# Patient Record
Sex: Male | Born: 1960 | Race: Black or African American | Hispanic: No | Marital: Married | Smoking: Never smoker
Health system: Southern US, Community
[De-identification: ages and names within clinical notes are randomized; demographics above are authoritative.]

## PROBLEM LIST (undated history)

## (undated) DIAGNOSIS — R0902 Hypoxemia: Secondary | ICD-10-CM

## (undated) DIAGNOSIS — G4730 Sleep apnea, unspecified: Secondary | ICD-10-CM

## (undated) HISTORY — DX: Sleep apnea, unspecified: G47.30

## (undated) HISTORY — DX: Hypoxemia: R09.02

---

## 2002-04-06 ENCOUNTER — Encounter: Payer: Self-pay | Admitting: Family Medicine

## 2002-04-06 ENCOUNTER — Encounter: Admission: RE | Admit: 2002-04-06 | Discharge: 2002-04-06 | Payer: Self-pay | Admitting: Family Medicine

## 2002-07-19 ENCOUNTER — Encounter: Admission: RE | Admit: 2002-07-19 | Discharge: 2002-08-16 | Payer: Self-pay | Admitting: Neurosurgery

## 2016-11-01 ENCOUNTER — Emergency Department (HOSPITAL_COMMUNITY)
Admission: EM | Admit: 2016-11-01 | Discharge: 2016-11-02 | Disposition: A | Discharge status: Home / Self Care | Payer: BLUE CROSS/BLUE SHIELD | Attending: Emergency Medicine | Admitting: Emergency Medicine

## 2016-11-01 ENCOUNTER — Encounter (HOSPITAL_COMMUNITY): Payer: Self-pay

## 2016-11-01 DIAGNOSIS — Y92410 Unspecified street and highway as the place of occurrence of the external cause: Secondary | ICD-10-CM | POA: Insufficient documentation

## 2016-11-01 DIAGNOSIS — Y939 Activity, unspecified: Secondary | ICD-10-CM | POA: Diagnosis not present

## 2016-11-01 DIAGNOSIS — S39012A Strain of muscle, fascia and tendon of lower back, initial encounter: Secondary | ICD-10-CM | POA: Insufficient documentation

## 2016-11-01 DIAGNOSIS — R51 Headache: Secondary | ICD-10-CM | POA: Diagnosis not present

## 2016-11-01 DIAGNOSIS — Y999 Unspecified external cause status: Secondary | ICD-10-CM | POA: Diagnosis not present

## 2016-11-01 DIAGNOSIS — S169XXA Unspecified injury of muscle, fascia and tendon at neck level, initial encounter: Secondary | ICD-10-CM | POA: Diagnosis present

## 2016-11-01 DIAGNOSIS — S161XXA Strain of muscle, fascia and tendon at neck level, initial encounter: Secondary | ICD-10-CM | POA: Insufficient documentation

## 2016-11-01 MED ORDER — OXYCODONE HCL 5 MG PO TABS
10.0000 mg | ORAL_TABLET | Freq: Once | ORAL | Status: AC
  Administered 2016-11-02: 10 mg via ORAL
  Filled 2016-11-01: qty 2

## 2016-11-01 MED ORDER — IBUPROFEN 400 MG PO TABS
800.0000 mg | ORAL_TABLET | Freq: Once | ORAL | Status: AC | PRN
  Administered 2016-11-01: 800 mg via ORAL

## 2016-11-01 MED ORDER — IBUPROFEN 400 MG PO TABS
ORAL_TABLET | ORAL | Status: AC
  Filled 2016-11-01: qty 2

## 2016-11-01 MED ORDER — METHOCARBAMOL 500 MG PO TABS
500.0000 mg | ORAL_TABLET | Freq: Once | ORAL | Status: AC
  Administered 2016-11-02: 500 mg via ORAL
  Filled 2016-11-01: qty 1
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
## 2016-11-02 ENCOUNTER — Encounter (HOSPITAL_COMMUNITY): Payer: Self-pay | Admitting: Radiology

## 2016-11-02 ENCOUNTER — Emergency Department (HOSPITAL_COMMUNITY): Payer: BLUE CROSS/BLUE SHIELD

## 2016-11-02 DIAGNOSIS — S161XXA Strain of muscle, fascia and tendon at neck level, initial encounter: Secondary | ICD-10-CM | POA: Diagnosis not present

## 2016-11-02 MED ORDER — METHOCARBAMOL 500 MG PO TABS: 500.0000 mg | ORAL_TABLET | Freq: Two times a day (BID) | ORAL | 0 refills | Status: DC

## 2016-11-02 MED ORDER — NAPROXEN 500 MG PO TABS: 500.0000 mg | ORAL_TABLET | Freq: Two times a day (BID) | ORAL | 0 refills | Status: DC
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
## 2016-11-02 NOTE — Discharge Instructions (Signed)
1. Medications: robaxin, naproxyn, usual home medications 2. Treatment: rest, drink plenty of fluids, gentle stretching as discussed, alternate ice and heat 3. Follow Up: Please followup with your primary doctor in 3 days for discussion of your diagnoses and further evaluation after today's visit; if you do not have a primary care doctor use the resource guide provided to find one;  Return to the ER for worsening back pain, difficulty walking, loss of bowel or bladder control, abdominal distention, blood in your urine or feces, loss of bowel or bladder control or other concerning symptoms

## 2018-10-27 ENCOUNTER — Encounter: Payer: Self-pay | Admitting: Internal Medicine

## 2020-01-03 ENCOUNTER — Other Ambulatory Visit: Payer: Self-pay | Admitting: Gastroenterology

## 2020-01-03 DIAGNOSIS — R7989 Other specified abnormal findings of blood chemistry: Secondary | ICD-10-CM

## 2020-01-05 ENCOUNTER — Telehealth: Payer: Self-pay

## 2020-01-05 ENCOUNTER — Ambulatory Visit
Admission: RE | Admit: 2020-01-05 | Discharge: 2020-01-05 | Disposition: A | Discharge status: Home / Self Care | Payer: BC Managed Care – PPO | Source: Ambulatory Visit | Attending: Gastroenterology | Admitting: Gastroenterology

## 2020-01-05 ENCOUNTER — Encounter (INDEPENDENT_AMBULATORY_CARE_PROVIDER_SITE_OTHER): Payer: Self-pay

## 2020-01-05 DIAGNOSIS — R7989 Other specified abnormal findings of blood chemistry: Secondary | ICD-10-CM

## 2020-01-05 NOTE — Telephone Encounter (Signed)
I called patient to see if he is still a patient here because we have received notes from Dr.Mann. the number we have on file Is incorrect. YRL,RMA

## 2020-01-07 LAB — HM COLONOSCOPY

## 2020-01-10 ENCOUNTER — Encounter: Payer: Self-pay | Admitting: Internal Medicine

## 2020-01-30 ENCOUNTER — Ambulatory Visit: Payer: BC Managed Care – PPO | Attending: Internal Medicine

## 2020-01-30 DIAGNOSIS — Z23 Encounter for immunization: Secondary | ICD-10-CM | POA: Insufficient documentation

## 2020-01-30 NOTE — Progress Notes (Signed)
   Covid-19 Vaccination Clinic  Name:  Jeffrey Barton    MRN: [id number] DOB: 1961-03-19  01/30/2020  Mr. Lack was observed post Covid-19 immunization for 15 minutes without incident. He was provided with Vaccine Information Sheet and instruction to access the V-Safe system.   [name] was instructed to call 911 with any severe reactions post vaccine: Marland Kitchen Difficulty breathing  . Swelling of face and throat  . A fast heartbeat  . A bad rash all over body  . Dizziness and weakness   Immunizations Administered    Name Date Dose VIS Date Route   Pfizer COVID-19 Vaccine 01/30/2020 12:17 PM 0.3 mL 11/05/2019 Intramuscular   Manufacturer: [employer], [employer]   Lot: [id number]   NDC: [address]-1

## 2020-02-02 ENCOUNTER — Telehealth: Payer: Self-pay
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
## 2020-02-22 ENCOUNTER — Ambulatory Visit: Payer: BC Managed Care – PPO | Attending: Internal Medicine

## 2020-02-22 DIAGNOSIS — Z23 Encounter for immunization: Secondary | ICD-10-CM

## 2020-02-22 NOTE — Progress Notes (Signed)
   Covid-19 Vaccination Clinic  Name:  Jeffrey Barton    MRN: [id number] DOB: 1961-07-20  02/22/2020  Mr. [name] was observed post Covid-19 immunization for 15 minutes without incident. He was provided with Vaccine Information Sheet and instruction to access the V-Safe system.   Mr. [name] was instructed to call 911 with any severe reactions post vaccine: Marland Kitchen Difficulty breathing  . Swelling of face and throat  . A fast heartbeat  . A bad rash all over body  . Dizziness and weakness   Immunizations Administered    Name Date Dose VIS Date Route   Pfizer COVID-19 Vaccine 02/22/2020 12:17 PM 0.3 mL 11/05/2019 Intramuscular   Manufacturer: [employer], [employer]   Lot: [id number]   NDC: [address]-4

## 2020-03-02 ENCOUNTER — Encounter: Payer: Self-pay | Admitting: Internal Medicine

## 2020-03-06 ENCOUNTER — Other Ambulatory Visit: Payer: Self-pay

## 2020-03-06 ENCOUNTER — Ambulatory Visit (INDEPENDENT_AMBULATORY_CARE_PROVIDER_SITE_OTHER): Payer: BC Managed Care – PPO | Admitting: Internal Medicine

## 2020-03-06 ENCOUNTER — Encounter: Payer: Self-pay | Admitting: Internal Medicine

## 2020-03-06 VITALS — BP 118/82 | HR 93 | Temp 98.3°F | Ht 68.0 in | Wt 239.2 lb

## 2020-03-06 DIAGNOSIS — Z712 Person consulting for explanation of examination or test findings: Secondary | ICD-10-CM

## 2020-03-06 DIAGNOSIS — E6609 Other obesity due to excess calories: Secondary | ICD-10-CM | POA: Insufficient documentation

## 2020-03-06 DIAGNOSIS — R7989 Other specified abnormal findings of blood chemistry: Secondary | ICD-10-CM | POA: Insufficient documentation

## 2020-03-06 DIAGNOSIS — E66812 Obesity, class 2: Secondary | ICD-10-CM

## 2020-03-06 DIAGNOSIS — Z6836 Body mass index (BMI) 36.0-36.9, adult: Secondary | ICD-10-CM

## 2020-03-06 DIAGNOSIS — D696 Thrombocytopenia, unspecified: Secondary | ICD-10-CM | POA: Insufficient documentation

## 2020-03-06 HISTORY — DX: Obesity, class 2: E66.812

## 2020-03-06 HISTORY — DX: Other obesity due to excess calories: E66.09

## 2020-03-06 NOTE — Patient Instructions (Signed)
Exercising to Stay Healthy To become healthy and stay healthy, it is recommended that you do moderate-intensity and vigorous-intensity exercise. You can tell that you are exercising at a moderate intensity if your heart starts beating faster and you start breathing faster but can still hold a conversation. You can tell that you are exercising at a vigorous intensity if you are breathing much harder and faster and cannot hold a conversation while exercising. Exercising regularly is important. It has many health benefits, such as:  Improving overall fitness, flexibility, and endurance.  Increasing bone density.  Helping with weight control.  Decreasing body fat.  Increasing muscle strength.  Reducing stress and tension.  Improving overall health. How often should I exercise? Choose an activity that you enjoy, and set realistic goals. Your health care provider can help you make an activity plan that works for you. Exercise regularly as told by your health care provider. This may include:  Doing strength training two times a week, such as: ? Lifting weights. ? Using resistance bands. ? Push-ups. ? Sit-ups. ? Yoga.  Doing a certain intensity of exercise for a given amount of time. Choose from these options: ? A total of 150 minutes of moderate-intensity exercise every week. ? A total of 75 minutes of vigorous-intensity exercise every week. ? A mix of moderate-intensity and vigorous-intensity exercise every week. Children, pregnant women, people who have not exercised regularly, people who are overweight, and older adults may need to talk with a health care provider about what activities are safe to do. If you have a medical condition, be sure to talk with your health care provider before you start a new exercise program. What are some exercise ideas? Moderate-intensity exercise ideas include:  Walking 1 mile (1.6 km) in about 15  minutes.  Biking.  Hiking.  Golfing.  Dancing.  Water aerobics. Vigorous-intensity exercise ideas include:  Walking 4.5 miles (7.2 km) or more in about 1 hour.  Jogging or running 5 miles (8 km) in about 1 hour.  Biking 10 miles (16.1 km) or more in about 1 hour.  Lap swimming.  Roller-skating or in-line skating.  Cross-country skiing.  Vigorous competitive sports, such as football, basketball, and soccer.  Jumping rope.  Aerobic dancing. What are some everyday activities that can help me to get exercise?  Yard work, such as: ? Pushing a lawn mower. ? Raking and bagging leaves.  Washing your car.  Pushing a stroller.  Shoveling snow.  Gardening.  Washing windows or floors. How can I be more active in my day-to-day activities?  Use stairs instead of an elevator.  Take a walk during your lunch break.  If you drive, park your car farther away from your work or school.  If you take public transportation, get off one stop early and walk the rest of the way.  Stand up or walk around during all of your indoor phone calls.  Get up, stretch, and walk around every 30 minutes throughout the day.  Enjoy exercise with a friend. Support to continue exercising will help you keep a regular routine of activity. What guidelines can I follow while exercising?  Before you start a new exercise program, talk with your health care provider.  Do not exercise so much that you hurt yourself, feel dizzy, or get very short of breath.  Wear comfortable clothes and wear shoes with good support.  Drink plenty of water while you exercise to prevent dehydration or heat stroke.  Work out until your breathing   and your heartbeat get faster. Where to find more information  [place] Department of Health and Human Services: www.hhs.gov  Centers for Disease Control and Prevention (CDC): www.cdc.gov Summary  Exercising regularly is important. It will improve your overall fitness,  flexibility, and endurance.  Regular exercise also will improve your overall health. It can help you control your weight, reduce stress, and improve your bone density.  Do not exercise so much that you hurt yourself, feel dizzy, or get very short of breath.  Before you start a new exercise program, talk with your health care provider. This information is not intended to replace advice given to you by your health care provider. Make sure you discuss any questions you have with your health care provider. Document Revised: 10/24/2017 Document Reviewed: 10/02/2017 Elsevier Patient Education  2020 [employer] Inc.  

## 2020-03-06 NOTE — Progress Notes (Signed)
This visit occurred during the SARS-CoV-2 public health emergency.  Safety protocols were in place, including screening questions prior to the visit, additional usage of staff PPE, and extensive cleaning of exam room while observing appropriate contact time as indicated for disinfecting solutions.  Subjective:     Patient ID: Jeffrey Barton , male    DOB: [birth year] , 59 y.o.   MRN: [id number]   Chief Complaint  Patient presents with  . Thyroid Problem    HPI  He is here today for f/u thyroid issues. He recently had TSH performed by GI, advised to come in by his provider, Lolita Cram NP for further evaluation. He denies previous h/o thyroid disease. He denies constipation, unintentional weight gain and cold intolerance.     History reviewed. No pertinent past medical history.   Family History  Problem Relation Age of Onset  . Healthy Mother      Current Outpatient Medications:  .  methocarbamol (ROBAXIN) 500 MG tablet, Take 1 tablet (500 mg total) by mouth 2 (two) times daily. (Patient not taking: Reported on 03/06/2020), Disp: 20 tablet, Rfl: 0 .  naproxen (NAPROSYN) 500 MG tablet, Take 1 tablet (500 mg total) by mouth 2 (two) times daily with a meal. (Patient not taking: Reported on 03/06/2020), Disp: 30 tablet, Rfl: 0   No Known Allergies   Review of Systems  Constitutional: Negative.   Respiratory: Negative.   Cardiovascular: Negative.   Gastrointestinal: Negative.   Neurological: Negative.   Psychiatric/Behavioral: Negative.      Today's Vitals   03/06/20 1141  BP: 118/82  Pulse: 93  Temp: 98.3 F (36.8 C)  Weight: 239 lb 3.2 oz (108.5 kg)  Height: 5\' 8"  (1.727 m)   Body mass index is 36.37 kg/m.   Objective:  Physical Exam Vitals and nursing note reviewed.  Constitutional:      Appearance: Normal appearance. He is obese.  Cardiovascular:     Rate and Rhythm: Normal rate and regular rhythm.     Heart sounds: Normal heart sounds.  Pulmonary:     Effort:  Pulmonary effort is normal.     Breath sounds: Normal breath sounds.  Skin:    General: Skin is warm.  Neurological:     General: No focal deficit present.     Mental Status: He is alert.  Psychiatric:        Mood and Affect: Mood normal.         Assessment And Plan:     1. Abnormal TSH  Previous lab results in full detail was discussed with the patient. I will check additional thyroid labs as listed below. I will make further recommendations once his labs are available for review. He agrees to rto in six months for repeat TSH testing if needed.   - T4, Free - T3, free - Thyroid Peroxidase Antibody  2. Thrombocytopenia (HCC)  CBC from GI reviewed in full detail. I will check repeat labs today.   - CBC no Diff - Hepatitis C antibody  3. Class 2 obesity due to excess calories without serious comorbidity with body mass index (BMI) of 36.0 to 36.9 in adult  He is encouraged to strive for BMi less than 30 to decrease cardiac risk. Importance of regular exercise was discussed with the patient. He is also encouraged to avoid sugary beverages and limit intake of processed/packaged foods.   , MD    THE PATIENT IS ENCOURAGED TO PRACTICE SOCIAL DISTANCING DUE TO THE COVID-19 PANDEMIC.

## 2020-03-07 ENCOUNTER — Encounter: Payer: Self-pay | Admitting: Internal Medicine

## 2020-03-07 LAB — CBC
Hematocrit: 50.2 % (ref 37.5–51.0)
Hemoglobin: 16.6 g/dL (ref 13.0–17.7)
MCH: 30.9 pg (ref 26.6–33.0)
MCHC: 33.1 g/dL (ref 31.5–35.7)
MCV: 93 fL (ref 79–97)
Platelets: 143 10*3/uL — ABNORMAL LOW (ref 150–450)
RBC: 5.38 x10E6/uL (ref 4.14–5.80)
RDW: 12 % (ref 11.6–15.4)
WBC: 6.2 10*3/uL (ref 3.4–10.8)

## 2020-03-07 LAB — HEPATITIS C ANTIBODY: Hep C Virus Ab: <0.1 s/co ratio (ref 0.0–0.9)

## 2020-03-07 LAB — THYROID PEROXIDASE ANTIBODY: Thyroperoxidase Ab SerPl-aCnc: 12 IU/mL (ref 0–34)

## 2020-03-07 LAB — T4, FREE: Free T4: 1.15 ng/dL (ref 0.82–1.77)

## 2020-03-07 LAB — T3, FREE: T3, Free: 3 pg/mL (ref 2.0–4.4)

## 2020-05-08 ENCOUNTER — Encounter: Payer: Self-pay | Admitting: Internal Medicine

## 2020-05-08 ENCOUNTER — Other Ambulatory Visit: Payer: Self-pay

## 2020-05-08 ENCOUNTER — Ambulatory Visit (INDEPENDENT_AMBULATORY_CARE_PROVIDER_SITE_OTHER): Payer: BC Managed Care – PPO | Admitting: Internal Medicine

## 2020-05-08 VITALS — BP 114/78 | HR 84 | Temp 98.5°F | Ht 67.0 in | Wt 246.8 lb

## 2020-05-08 DIAGNOSIS — R351 Nocturia: Secondary | ICD-10-CM | POA: Diagnosis not present

## 2020-05-08 DIAGNOSIS — G4726 Circadian rhythm sleep disorder, shift work type: Secondary | ICD-10-CM | POA: Diagnosis not present
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
## 2020-05-15 ENCOUNTER — Telehealth: Payer: Self-pay

## 2020-05-15 ENCOUNTER — Ambulatory Visit: Payer: BC Managed Care – PPO | Admitting: Internal Medicine

## 2020-05-15 NOTE — Telephone Encounter (Signed)
I called the pt because Dr. Allyne Gee wants to know did he call DMV to find out why he received paperwork. Is he going to move forward with whatswas discussed during his visit? Going to Neuro for sleep study?

## 2020-05-15 NOTE — Telephone Encounter (Signed)
The pt called and said that he spoke to some one named Doristine Church at the [facility] at was told that page 2 of the packet will determine how much of the packet has to be done and what is necessary.

## 2020-05-15 NOTE — Telephone Encounter (Signed)
I was calling the pt because Dr. Allyne Gee wanted to know if the pt called the DMV to see why he needed to have the form filled out.

## 2020-05-23 ENCOUNTER — Telehealth: Payer: Self-pay

## 2020-05-23 NOTE — Telephone Encounter (Signed)
I called the pt to let him know that Dr Allyne Gee said that the pt needed to ask for an extension because she is unable to answer some of the questions on the form until he has his neuro evaluation. Does he have an appointment yet?

## 2020-05-23 NOTE — Telephone Encounter (Signed)
I called the pt to let him know that Dr Allyne Gee said that the pt needed to ask for an extension because she is unable to answer some of the questions on the form until he has his neuro evaluation. Does he have an appointment yet? THE PT SAID NO HE HASN'T RECEIVED A CALL YET FOR AN APPOINTMENT FOR NEURO EVALUATION.

## 2020-06-05 ENCOUNTER — Telehealth: Payer: Self-pay

## 2020-06-05 NOTE — Telephone Encounter (Signed)
The pt came in and said that the [employer] said that they needed page 2 of the form by this Friday.  The pt was reminded that Dr Allyne Gee said for the pt to ask for an extension on his forms because the pt needed to have an evaluation from the neurologist first before the forms can be filled out.

## 2020-06-15 ENCOUNTER — Ambulatory Visit: Payer: BC Managed Care – PPO | Admitting: Neurology

## 2020-06-15 ENCOUNTER — Encounter: Payer: Self-pay | Admitting: Neurology

## 2020-06-15 VITALS — BP 122/82 | HR 76 | Ht 70.0 in | Wt 246.0 lb

## 2020-06-15 DIAGNOSIS — R351 Nocturia: Secondary | ICD-10-CM

## 2020-06-15 DIAGNOSIS — G4719 Other hypersomnia: Secondary | ICD-10-CM | POA: Diagnosis not present

## 2020-06-15 DIAGNOSIS — G4726 Circadian rhythm sleep disorder, shift work type: Secondary | ICD-10-CM

## 2020-06-15 DIAGNOSIS — E669 Obesity, unspecified: Secondary | ICD-10-CM | POA: Diagnosis not present
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
## 2020-06-15 NOTE — Patient Instructions (Signed)

## 2020-07-02 ENCOUNTER — Ambulatory Visit (INDEPENDENT_AMBULATORY_CARE_PROVIDER_SITE_OTHER): Payer: BC Managed Care – PPO | Admitting: Neurology

## 2020-07-02 DIAGNOSIS — G4719 Other hypersomnia: Secondary | ICD-10-CM

## 2020-07-02 DIAGNOSIS — R351 Nocturia: Secondary | ICD-10-CM

## 2020-07-02 DIAGNOSIS — G4720 Circadian rhythm sleep disorder, unspecified type: Secondary | ICD-10-CM

## 2020-07-02 DIAGNOSIS — G4733 Obstructive sleep apnea (adult) (pediatric): Secondary | ICD-10-CM

## 2020-07-02 DIAGNOSIS — E669 Obesity, unspecified: Secondary | ICD-10-CM

## 2020-07-02 DIAGNOSIS — G4726 Circadian rhythm sleep disorder, shift work type: Secondary | ICD-10-CM

## 2020-07-10 ENCOUNTER — Encounter: Payer: Self-pay | Admitting: Neurology

## 2020-07-10 ENCOUNTER — Encounter: Payer: Self-pay | Admitting: Internal Medicine

## 2020-07-10 ENCOUNTER — Telehealth: Payer: Self-pay | Admitting: Neurology

## 2020-07-10 ENCOUNTER — Telehealth: Payer: Self-pay

## 2020-07-10 NOTE — Telephone Encounter (Signed)
The pt called and left a message that he had his sleep study done August 8th and wanted to know if Dr. Allyne Gee could fill out the [facility] form. I left the pt a message that Dr. Allyne Gee was out of the office last week and that the office hasn't received the results of the sleep study.

## 2020-07-10 NOTE — Telephone Encounter (Signed)
Pt calling inquiring about Sleep Study results. Pt need results before DMV deadline on September 10.

## 2020-07-10 NOTE — Addendum Note (Signed)
Addended by: [name] on: 07/10/2020 03:31 PM   Modules accepted: Orders
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
## 2020-07-10 NOTE — Progress Notes (Signed)
Patient referred by Dr. Allyne Gee, seen by me on 06/15/20, diagnostic PSG on 07/02/20.   Please call and notify the patient that the recent sleep study showed severe obstructive sleep apnea. I recommend treatment for this in the form of CPAP. This will require a repeat sleep study for proper titration and mask fitting and correct monitoring of the oxygen saturations. Please explain to patient. I have placed an order in the chart, we will try to expedite.  Thanks.  [name], MD, PhD [facility])

## 2020-07-11 ENCOUNTER — Telehealth: Payer: Self-pay

## 2020-07-11 NOTE — Telephone Encounter (Signed)
-----   Message from [name], MD sent at 07/10/2020  3:31 PM EDT ----- Patient referred by Dr. Allyne Gee, seen by me on 06/15/20, diagnostic PSG on 07/02/20.   Please call and notify the patient that the recent sleep study showed severe obstructive sleep apnea. I recommend treatment for this in the form of CPAP. This will require a repeat sleep study for proper titration and mask fitting and correct monitoring of the oxygen saturations. Please explain to patient. I have placed an order in the chart, we will try to expedite.  Thanks.  [name], MD, PhD [facility])

## 2020-07-11 NOTE — Telephone Encounter (Signed)
I called Jeffrey Barton. I advised Jeffrey Barton that Dr. Athar reviewed their sleep study results and found that severe osa was found and recommends that Jeffrey Barton be treated with a cpap. Dr. Athar recommends that Jeffrey Barton return for a repeat sleep study in order to properly titrate the cpap and ensure a good mask fit. Jeffrey Barton is agreeable to returning for a titration study. I advised Jeffrey Barton that our sleep lab will file with Jeffrey Barton's insurance and call Jeffrey Barton to schedule the sleep study when we hear back from the Jeffrey Barton's insurance regarding coverage of this sleep study. Jeffrey Barton verbalized understanding of results. Jeffrey Barton had no questions at this time but was encouraged to call back if questions arise. ° ° °

## 2020-07-17 ENCOUNTER — Ambulatory Visit (INDEPENDENT_AMBULATORY_CARE_PROVIDER_SITE_OTHER): Payer: BC Managed Care – PPO | Admitting: Neurology

## 2020-07-17 DIAGNOSIS — G4719 Other hypersomnia: Secondary | ICD-10-CM

## 2020-07-17 DIAGNOSIS — G4726 Circadian rhythm sleep disorder, shift work type: Secondary | ICD-10-CM

## 2020-07-17 DIAGNOSIS — G4733 Obstructive sleep apnea (adult) (pediatric): Secondary | ICD-10-CM | POA: Diagnosis not present

## 2020-07-17 DIAGNOSIS — G4720 Circadian rhythm sleep disorder, unspecified type: Secondary | ICD-10-CM

## 2020-07-17 DIAGNOSIS — E669 Obesity, unspecified: Secondary | ICD-10-CM

## 2020-07-17 DIAGNOSIS — G4731 Primary central sleep apnea: Secondary | ICD-10-CM

## 2020-07-17 DIAGNOSIS — R351 Nocturia: Secondary | ICD-10-CM

## 2020-07-18 ENCOUNTER — Telehealth: Payer: Self-pay

## 2020-07-18 NOTE — Telephone Encounter (Signed)
The pt was notified that his DMV form has been completed, faxed to the [facility], a copy has been kept for his chart, and his original is up front for pickup.

## 2020-07-21 LAB — HEPATIC FUNCTION PANEL
ALT: 20 (ref 10–40)
AST: 18 (ref 14–40)
Alkaline Phosphatase: 76 (ref 25–125)
Bilirubin, Direct: 0.11 (ref 0.01–0.4)
Bilirubin, Total: 0.3

## 2020-07-21 LAB — COMPREHENSIVE METABOLIC PANEL: Albumin: 4.1 (ref 3.5–5.0)

## 2020-07-27 ENCOUNTER — Encounter: Payer: Self-pay | Admitting: Neurology

## 2020-07-28 NOTE — Procedures (Signed)
PATIENT'S NAME:  Jeffrey Barton, Jeffrey Barton DOB:      November 29, 1960      MR#:    [id number]     DATE OF RECORDING: 07/17/2020 REFERRING M.D.:  Dorothyann Peng MD Study Performed:   CPAP  Titration HISTORY: 59 year old man, who presents for a full night titration study. His baseline sleep study from 07/02/20 showed severe obstructive sleep apnea, with a total AHI of 37.8/hour, REM AHI of 21.8/hour, supine AHI of 44.6/hour and O2 nadir of 78%. The patient endorsed the Epworth Sleepiness Scale at 8/24 points. BMI of 35.3 kg/m2. The patient's neck circumference measured 16.8 inches.  CURRENT MEDICATIONS: Tylenol   PROCEDURE:  This is a multichannel digital polysomnogram utilizing the SomnoStar 11.2 system.  Electrodes and sensors were applied and monitored per AASM Specifications.   EEG, EOG, Chin and Limb EMG, were sampled at 200 Hz.  ECG, Snore and Nasal Pressure, Thermal Airflow, Respiratory Effort, CPAP Flow and Pressure, Oximetry was sampled at 50 Hz. Digital video and audio were recorded.      The patient was fitted with a large Vitera full facemask due to mouth breathing.  He was initiated on CPAP per AASM standards, on a pressure of 5 cm.  He was titrated to a CPAP pressure of 15 cm but had to be switched to BiPAP therapy due to higher pressure requirement and exhibiting central apneas from the beginning of the study even on low CPAP therapy. He was started on 16/12 cm at epoch 807, 04:34 and his central apneas persisted and a backup rate of 16 was added and 05:06, epoch 871. His final pressure settings were 19/15 with a rate of 16, at which time his AHI was 3/h, O2 nadir 94% with supine non-REM sleep achieved only.  He may have higher pressure requirements even when in rem sleep, unfortunately, REM sleep was not achieved during the study. A dedicated BiPAP titration study may have to be considered in the future.  Lights Out was at 21:54 and Lights On at 05:26. Total recording time (TRT) was 452.5 minutes, with a  total sleep time (TST) of 374.5 minutes. The patient's sleep latency was 0.5 minutes. REM sleep was absent. The sleep efficiency was 82.8 %.    SLEEP ARCHITECTURE: WASO (Wake after sleep onset) was 77 minutes, with mild to moderate sleep fragmentation noted. There were 4.5 minutes in Stage N1, 370 minutes Stage N2, 0 minutes Stage N3 and 0 minutes in Stage REM.  The percentage of Stage N1 was 1.2%, Stage N2 was 98.8%, which is highly increased, Stage N3 and Stage R (REM sleep) were absent. The arousals were noted as: 34 were spontaneous, 11 were associated with PLMs, 14 were associated with respiratory events.  RESPIRATORY ANALYSIS:  There was a total of 145 respiratory events: 1 obstructive apnea, 40 central apneas and 0 mixed apneas with a total of 41 apneas and an apnea index (AI) of 6.6 /hour. There were 104 hypopneas with a hypopnea index of 16.7/hour.   The total APNEA/HYPOPNEA INDEX  (AHI) was 23.2 /hour and the total RESPIRATORY DISTURBANCE INDEX was 23.2 /hour, 145 events in NREM. The REM AHI was n/a vs. a non-REM AHI of 23.2 /hour.  The patient spent 250 minutes of total sleep time in the supine position and 125 minutes in non-supine. The supine AHI was 24.5, versus a non-supine AHI of 20.7.  OXYGEN SATURATION & C02:  The baseline 02 saturation was 92%, with the lowest being 84%. Time spent below 89% saturation  equaled 5 minutes.  PERIODIC LIMB MOVEMENTS:  The patient had a total of 70 Periodic Limb Movements. The Periodic Limb Movement (PLM) index was 11.2 and the PLM Arousal index was 1.8 /hour. Audio and video analysis did not show any abnormal or unusual movements, behaviors, phonations or vocalizations. The patient took 1 bathroom break. The EKG was in keeping with normal sinus rhythm (NSR). Post-study, the patient indicated that sleep was better than usual.    IMPRESSION: 1. Obstructive Sleep Apnea (OSA) 2. Central sleep apnea 3. Dysfunctions associated with sleep stages or arousal  from sleep   RECOMMENDATIONS: 1. This was a somewhat suboptimal titration study as REM sleep was not achieved on the final pressure.  The patient had central sleep apneas throughout the night.  He did have evidence of central apneas to some degree during his baseline sleep study as well.  His sleep disordered breathing did not have complete resolution on CPAP therapy only and he needed higher pressures.  Standard BiPAP therapy was not successful in eliminating the central component of his sleep apnea.  He was therefore placed on BiPAP ST with a final pressure of 19/15 with a rate of 16.  However, he may need additional pressure support when achieving REM sleep.  For now, he will be advised to start home BiPAP ST and we will follow his symptoms and compliance.  He may benefit from a dedicated BiPAP titration study in the near future. The patient should be reminded, that it may take up to 3 months to get fully used to using PAP with all planned sleep. The earlier full compliance is achieved, the better long term compliance tends to be. Please note that untreated obstructive sleep apnea may carry additional perioperative morbidity. Patients with significant obstructive sleep apnea should receive perioperative PAP therapy and the surgeons and particularly the anesthesiologist should be informed of the diagnosis and the severity of the sleep disordered breathing. 2. This study shows sleep fragmentation and abnormal sleep stage percentages; these are nonspecific findings and per se do not signify an intrinsic sleep disorder or a cause for the patient's sleep-related symptoms. Causes include (but are not limited to) the first night effect of the sleep study, circadian rhythm disturbances, medication effect or an underlying mood disorder or medical problem.  3. The patient should be cautioned not to drive, work at heights, or operate dangerous or heavy equipment when tired or sleepy. Review and reiteration of good  sleep hygiene measures should be pursued with any patient. 4. The patient will be seen in follow-up in the sleep clinic at [facility] for discussion of the test results, symptom and treatment compliance review, further management strategies, etc. The referring provider will be notified of the test results.   I certify that I have reviewed the entire raw data recording prior to the issuance of this report in accordance with the Standards of Accreditation of the American Academy of Sleep Medicine (AASM)   [name], MD, PhD Diplomat, American Board of Neurology and Sleep Medicine (Neurology and Sleep Medicine)

## 2020-07-28 NOTE — Progress Notes (Signed)
Patient referred by Dr. Allyne Gee, seen by me on 06/15/20, diagnostic PSG on 07/02/20. Patient had a CPAP titration study on 07/17/20.  Please call and inform patient that I have entered an order for treatment with positive airway pressure (PAP) treatment for obstructive sleep apnea (OSA). He did fairly well during the sleep study but needed higher pressures and also had only light state sleep, did not achieve any deep sleep or dream sleep.  He required a pressure setting called BiPAP ST.  We will arrange for a machine through a DME (durable medical equipment) company of His choice; and I will see the patient back in follow-up in about 10 weeks. Please also explain to the patient that I will be looking out for compliance data, which can be downloaded from the machine (stored on an SD card, that is inserted in the machine) or via remote access through a modem, that is built into the machine. At the time of the followup appointment we will discuss sleep study results and how it is going with PAP treatment at home. Please advise patient to bring His machine at the time of the first FU visit, even though this is cumbersome. Bringing the machine for every visit after that will likely not be needed, but often helps for the first visit to troubleshoot if needed. Please re-enforce the importance of compliance with treatment and the need for [place] to monitor compliance data - often an insurance requirement and actually good feedback for the patient as far as how they are doing.  Also remind patient, that any interim PAP machine or mask issues should be first addressed with the DME company, as they can often help better with technical and mask fit issues. Please ask if patient has a preference regarding DME company.  Please also make sure, the patient has a follow-up appointment with me in about 10 weeks from the setup date, thanks. May see one of our nurse practitioners if needed for proper timing of the FU appointment.  Please  fax or rout report to the referring provider. Thanks,   [name], MD, PhD [facility])

## 2020-07-28 NOTE — Addendum Note (Signed)
Addended by: [name] on: 07/28/2020 11:50 AM   Modules accepted: Orders

## 2020-08-01 ENCOUNTER — Telehealth: Payer: Self-pay

## 2020-08-01 NOTE — Telephone Encounter (Signed)
I called pt. I advised pt that Dr. Frances Furbish reviewed their sleep study results and found that pt did well during the cpap titration study on the BiPAP. Dr. Frances Furbish recommends that pt start BiPAP treatment at home. I reviewed PAP compliance expectations with the pt. Pt is agreeable to starting a BiPAP. I advised pt that an order will be sent to a DME, [facility], and [facility] will call the pt within about one week after they file with the pt's insurance. [facility] will show the pt how to use the machine, fit for masks, and troubleshoot the BiPAP if needed. A follow up appt was made for insurance purposes with Dr. Frances Furbish on 10/25/2020 at 1130 am. Pt verbalized understanding to arrive 15 minutes early and bring their BiPAP. A letter with all of this information in it will be mailed to the pt as a reminder. I verified with the pt that the address we have on file is correct. Pt verbalized understanding of results. Pt had no questions at this time but was encouraged to call back if questions arise. I have sent the order to [employer] and have received confirmation that they have received the order.

## 2020-08-01 NOTE — Telephone Encounter (Signed)
-----   Message from [name], MD sent at 07/28/2020 11:49 AM EDT ----- Patient referred by Dr. Allyne Gee, seen by me on 06/15/20, diagnostic PSG on 07/02/20. Patient had a CPAP titration study on 07/17/20.  Please call and inform patient that I have entered an order for treatment with positive airway pressure (PAP) treatment for obstructive sleep apnea (OSA). He did fairly well during the sleep study but needed higher pressures and also had only light state sleep, did not achieve any deep sleep or dream sleep.  He required a pressure setting called BiPAP ST.  We will arrange for a machine through a DME (durable medical equipment) company of His choice; and I will see the patient back in follow-up in about 10 weeks. Please also explain to the patient that I will be looking out for compliance data, which can be downloaded from the machine (stored on an SD card, that is inserted in the machine) or via remote access through a modem, that is built into the machine. At the time of the followup appointment we will discuss sleep study results and how it is going with PAP treatment at home. Please advise patient to bring His machine at the time of the first FU visit, even though this is cumbersome. Bringing the machine for every visit after that will likely not be needed, but often helps for the first visit to troubleshoot if needed. Please re-enforce the importance of compliance with treatment and the need for [place] to monitor compliance data - often an insurance requirement and actually good feedback for the patient as far as how they are doing.  Also remind patient, that any interim PAP machine or mask issues should be first addressed with the DME company, as they can often help better with technical and mask fit issues. Please ask if patient has a preference regarding DME company.  Please also make sure, the patient has a follow-up appointment with me in about 10 weeks from the setup date, thanks. May see one of our nurse  practitioners if needed for proper timing of the FU appointment.  Please fax or rout report to the referring provider. Thanks,   [name], MD, PhD [facility])

## 2020-09-05 ENCOUNTER — Encounter: Payer: BC Managed Care – PPO | Admitting: Internal Medicine

## 2020-10-07 ENCOUNTER — Ambulatory Visit: Payer: BC Managed Care – PPO | Attending: Internal Medicine

## 2020-10-07 DIAGNOSIS — Z23 Encounter for immunization: Secondary | ICD-10-CM

## 2020-10-07 NOTE — Progress Notes (Signed)
   Covid-19 Vaccination Clinic  Name:  Jeffrey Barton    MRN: [id number] DOB: Apr 12, 1961  10/07/2020  [name] was observed post Covid-19 immunization for 15 minutes without incident. He was provided with Vaccine Information Sheet and instruction to access the V-Safe system.   [name] was instructed to call 911 with any severe reactions post vaccine: Marland Kitchen Difficulty breathing  . Swelling of face and throat  . A fast heartbeat  . A bad rash all over body  . Dizziness and weakness   Immunizations Administered    Name Date Dose VIS Date Route   Pfizer COVID-19 Vaccine 10/07/2020  3:05 PM 0.3 mL 09/13/2020 Intramuscular   Manufacturer: [employer], [employer]   Lot: [id number]   NDC: [address]-6

## 2020-10-25 ENCOUNTER — Ambulatory Visit: Payer: Self-pay | Admitting: Neurology

## 2020-10-30 ENCOUNTER — Encounter: Payer: Self-pay | Admitting: Neurology

## 2020-10-30 ENCOUNTER — Encounter: Payer: BC Managed Care – PPO | Admitting: Nurse Practitioner

## 2020-10-30 ENCOUNTER — Ambulatory Visit: Payer: BC Managed Care – PPO | Admitting: Neurology

## 2020-10-30 VITALS — BP 146/90 | HR 67 | Ht 70.0 in | Wt 251.0 lb

## 2020-10-30 DIAGNOSIS — G4733 Obstructive sleep apnea (adult) (pediatric): Secondary | ICD-10-CM | POA: Diagnosis not present

## 2020-10-30 DIAGNOSIS — G4731 Primary central sleep apnea: Secondary | ICD-10-CM

## 2020-10-30 DIAGNOSIS — Z789 Other specified health status: Secondary | ICD-10-CM | POA: Diagnosis not present

## 2020-10-30 NOTE — Patient Instructions (Signed)
Please continue using your BiPAP regularly. While your insurance requires that you use BiPAP at least 4 hours each night on 70% of the nights, I recommend, that you not skip any nights and use it throughout the night if you can. Getting used to BiPAP and staying with the treatment long term does take time and patience and discipline. Untreated obstructive sleep apnea when it is moderate to severe can have an adverse impact on cardiovascular health and raise her risk for heart disease, arrhythmias, hypertension, congestive heart failure, stroke and diabetes. Untreated obstructive sleep apnea causes sleep disruption, nonrestorative sleep, and sleep deprivation. This can have an impact on your day to day functioning and cause daytime sleepiness and impairment of cognitive function, memory loss, mood disturbance, and problems focussing. Using BiPAP regularly can improve these symptoms.  Please continue to work on using your BiPAP machine consistently every night.  You are not quite there yet when it comes to using the machine more than 4 hours for more than 70% of the time.  The first 90 days will be by November 30, 2020 or thereabouts.  As discussed, I would like to reduce the pressure to 18/14 centimeters at this time from currently 19/15 to see if your mouth dryness improves and urine leak is less and you can tolerate the pressure a little better.  Please call us in about a month or email through MyChart as a reminder for [place] to look at your compliance data for the previous 30 days, we will call you with the report feedback.  Please follow-up to see one of our nurse practitioners in 3 months in this office.
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
## 2020-10-30 NOTE — Progress Notes (Signed)
Order for bipap pressure change sent to [facility] via community message. Confirmation received that the order transmitted was successful.

## 2020-11-01 ENCOUNTER — Other Ambulatory Visit: Payer: Self-pay

## 2020-11-01 ENCOUNTER — Encounter: Payer: Self-pay | Admitting: Nurse Practitioner

## 2020-11-01 ENCOUNTER — Ambulatory Visit (INDEPENDENT_AMBULATORY_CARE_PROVIDER_SITE_OTHER): Payer: BC Managed Care – PPO | Admitting: Nurse Practitioner

## 2020-11-01 VITALS — BP 132/72 | HR 78 | Temp 98.7°F | Ht 70.0 in | Wt 251.8 lb

## 2020-11-01 DIAGNOSIS — E6609 Other obesity due to excess calories: Secondary | ICD-10-CM

## 2020-11-01 DIAGNOSIS — Z0000 Encounter for general adult medical examination without abnormal findings: Secondary | ICD-10-CM

## 2020-11-01 DIAGNOSIS — Z2821 Immunization not carried out because of patient refusal: Secondary | ICD-10-CM | POA: Diagnosis not present

## 2020-11-01 DIAGNOSIS — H6123 Impacted cerumen, bilateral: Secondary | ICD-10-CM | POA: Diagnosis not present

## 2020-11-01 DIAGNOSIS — Z6836 Body mass index (BMI) 36.0-36.9, adult: Secondary | ICD-10-CM

## 2020-11-01 LAB — POC HEMOCCULT BLD/STL (OFFICE/1-CARD/DIAGNOSTIC)
Card #1 Date: 12082021
Fecal Occult Blood, POC: NEGATIVE

## 2020-11-01 NOTE — Patient Instructions (Signed)

## 2020-11-01 NOTE — Progress Notes (Signed)
I,Tianna Badgett,acting as a Education administrator for [employer], NP.,have documented all relevant documentation on the behalf of [employer], NP,as directed by  Bary Castilla, NP while in the presence of Bary Castilla, NP.  This visit occurred during the SARS-CoV-2 public health emergency.  Safety protocols were in place, including screening questions prior to the visit, additional usage of staff PPE, and extensive cleaning of exam room while observing appropriate contact time as indicated for disinfecting solutions.  Subjective:     Patient ID: Jeffrey Barton , male    DOB: [birth year] , 59 y.o.   MRN: [id number]   Chief Complaint  Patient presents with  . Annual Exam    HPI  Patient is here for annual physical exam today. He does not have any concerns at this time. He is not taking any medication. He gets his eye exam done yearly and visits the dentist routinely.   Works in the [employer]: He does not eat that healthy due to working. He snacks a lot every 2 hours at work. Crackers and peanut butter.  Exercise: He walks and lifts at work.  Married: Yes and he is sexually active with his wife.  He visits the doctor and eye doctor once a year.  Drinks alcohol: on the weekends beer. Denies smoking/ tobacco products.      No past medical history on file.   Family History  Problem Relation Age of Onset  . Healthy Mother      Current Outpatient Medications:  .  Acetaminophen (TYLENOL 8 HOUR PO), Take by mouth., Disp: , Rfl:    No Known Allergies   Men's preventive visit. Patient Health Questionnaire (PHQ-2) is    Office Visit from 03/06/2020 in [facility]  PHQ-2 Total Score 0    . Patient is on a diet. Marital status: Married. Relevant history for alcohol use is:  Social History   Substance and Sexual Activity  Alcohol Use Yes   Comment: occasionally  . Relevant history for tobacco use is:  Social History    Tobacco Use  Smoking Status Never Smoker  Smokeless Tobacco Never Used  .   Review of Systems  Constitutional: Negative.  Negative for appetite change and unexpected weight change.  HENT: Negative.  Negative for sinus pain, sore throat and tinnitus.   Eyes: Negative.   Respiratory: Negative.  Negative for cough and wheezing.   Cardiovascular: Negative.  Negative for chest pain and leg swelling.  Gastrointestinal: Negative.  Negative for abdominal pain, constipation, diarrhea, nausea and vomiting.  Endocrine: Negative.   Genitourinary: Negative.  Negative for difficulty urinating and urgency.  Musculoskeletal: Negative.  Negative for arthralgias and myalgias.  Skin: Negative.   Allergic/Immunologic: Negative.   Neurological: Negative.  Negative for weakness, light-headedness and headaches.  Hematological: Negative.   Psychiatric/Behavioral: Negative.      Today's Vitals   11/01/20 1059  BP: 132/72  Pulse: 78  Temp: 98.7 F (37.1 C)  TempSrc: Oral  Weight: 251 lb 12.8 oz (114.2 kg)  Height: $Remove'5\' 10"'WMFRNNc$  (1.778 m)   Body mass index is 36.13 kg/m.  Wt Readings from Last 3 Encounters:  11/01/20 251 lb 12.8 oz (114.2 kg)  10/30/20 251 lb (113.9 kg)  06/15/20 (!) 246 lb (111.6 kg)     Objective:  Physical Exam Exam conducted with a chaperone present.  Constitutional:      Appearance: He is obese.  HENT:     Head: Normocephalic.  Right Ear: Tympanic membrane normal. There is impacted cerumen.     Left Ear: Tympanic membrane normal. There is impacted cerumen.     Nose:     Comments: Deffered. Patient is masked.     Mouth/Throat:     Comments: Deferred. Patient is masked.  Eyes:     Extraocular Movements: Extraocular movements intact.     Conjunctiva/sclera: Conjunctivae normal.     Pupils: Pupils are equal, round, and reactive to light.  Cardiovascular:     Rate and Rhythm: Normal rate and regular rhythm.     Pulses: Normal pulses.     Heart sounds: Normal heart  sounds. No murmur heard.   Pulmonary:     Effort: Pulmonary effort is normal.     Breath sounds: Normal breath sounds. No wheezing.  Chest:     Chest wall: No tenderness.  Abdominal:     General: Bowel sounds are normal.     Palpations: Abdomen is soft.     Tenderness: There is no abdominal tenderness. There is no guarding.  Genitourinary:    Prostate: Normal. Not tender.     Rectum: No tenderness.     Comments: Prostate exam conducted  Musculoskeletal:        General: Normal range of motion.     Cervical back: Normal range of motion.  Skin:    General: Skin is warm and dry.     Capillary Refill: Capillary refill takes less than 2 seconds.  Neurological:     Mental Status: He is alert and oriented to person, place, and time.  Psychiatric:        Mood and Affect: Mood normal.        Behavior: Behavior normal. Behavior is cooperative.        Thought Content: Thought content normal.        Judgment: Judgment normal.         Assessment And Plan:    1. Encounter for annual physical exam -Patient is not any medication at this time.  -Patient educated about eating a healthy diet which includes avoiding saturated, high fatty foods, avoid fast food, incorporate more fruits and vegetables in his diet. Incorporate exercise into daily routine including 30-45 min of exercise 4-5 a day.  -Educated about the importance of yearly screenings, immunization and immunization.  - CBC - Hemoglobin A1c - CMP14+EGFR - Lipid panel - PSA - POC Hemoccult Bld/Stl (1-Cd Office Dx)  2. Bilateral impacted cerumen -Ear curette used for bilateral cerumen impaction.  - Ear Lavage done today at the office.   3. Class 2 obesity due to excess calories without serious comorbidity with body mass index (BMI) of 36.0 to 36.9 in adult -Educated patient about the importance of a healthy diet which includes avoiding saturated fats, fast food, sugary drinks and foods, high fatty dairy products. Increase the  intake of fruits and vegetables. Incorporate greens into the diet. Also educated patient about the importance of exercise 4-5 days a week for atleast 30-45 min a day.    4. Influenza vaccination declined Patient declined the influenza vaccine at this time.    Patient was given opportunity to ask questions. Patient verbalized understanding of the plan and was able to repeat key elements of the plan. All questions were answered to their satisfaction.   Bary Castilla, NP   I, Bary Castilla, NP, have reviewed all documentation for this visit. The documentation on 11/01/20 for the exam, diagnosis, procedures, and orders are all accurate and complete.  THE PATIENT IS ENCOURAGED TO PRACTICE SOCIAL DISTANCING DUE TO THE COVID-19 PANDEMIC.   

## 2020-11-02 ENCOUNTER — Other Ambulatory Visit: Payer: Self-pay

## 2020-11-02 ENCOUNTER — Other Ambulatory Visit: Payer: Self-pay | Admitting: Nurse Practitioner

## 2020-11-02 DIAGNOSIS — D696 Thrombocytopenia, unspecified: Secondary | ICD-10-CM

## 2020-11-02 LAB — LIPID PANEL
Chol/HDL Ratio: 4.4 ratio (ref 0.0–5.0)
Cholesterol, Total: 235 mg/dL — ABNORMAL HIGH (ref 100–199)
HDL: 54 mg/dL (ref >39)
LDL Chol Calc (NIH): 158 mg/dL — ABNORMAL HIGH (ref 0–99)
Triglycerides: 131 mg/dL (ref 0–149)
VLDL Cholesterol Cal: 23 mg/dL (ref 5–40)

## 2020-11-02 LAB — CMP14+EGFR
ALT: 19 IU/L (ref 0–44)
AST: 21 IU/L (ref 0–40)
Albumin/Globulin Ratio: 1.5 (ref 1.2–2.2)
Albumin: 4.3 g/dL (ref 3.8–4.9)
Alkaline Phosphatase: 77 IU/L (ref 44–121)
BUN/Creatinine Ratio: 13 (ref 9–20)
BUN: 18 mg/dL (ref 6–24)
Bilirubin Total: 0.2 mg/dL (ref 0.0–1.2)
CO2: 25 mmol/L (ref 20–29)
Calcium: 9.8 mg/dL (ref 8.7–10.2)
Chloride: 110 mmol/L — ABNORMAL HIGH (ref 96–106)
Creatinine, Ser: 1.37 mg/dL — ABNORMAL HIGH (ref 0.76–1.27)
GFR calc Af Amer: 65 mL/min/{1.73_m2} (ref >59)
GFR calc non Af Amer: 56 mL/min/{1.73_m2} — ABNORMAL LOW (ref >59)
Globulin, Total: 2.8 g/dL (ref 1.5–4.5)
Glucose: 86 mg/dL (ref 65–99)
Potassium: 4.6 mmol/L (ref 3.5–5.2)
Sodium: 151 mmol/L — ABNORMAL HIGH (ref 134–144)
Total Protein: 7.1 g/dL (ref 6.0–8.5)

## 2020-11-02 LAB — CBC
Hematocrit: 49.1 % (ref 37.5–51.0)
Hemoglobin: 16.5 g/dL (ref 13.0–17.7)
MCH: 30.6 pg (ref 26.6–33.0)
MCHC: 33.6 g/dL (ref 31.5–35.7)
MCV: 91 fL (ref 79–97)
Platelets: 137 10*3/uL — ABNORMAL LOW (ref 150–450)
RBC: 5.39 x10E6/uL (ref 4.14–5.80)
RDW: 12.1 % (ref 11.6–15.4)
WBC: 6.2 10*3/uL (ref 3.4–10.8)

## 2020-11-02 LAB — HEMOGLOBIN A1C
Est. average glucose Bld gHb Est-mCnc: 111 mg/dL
Hgb A1c MFr Bld: 5.5 % (ref 4.8–5.6)

## 2020-11-02 LAB — PSA: Prostate Specific Ag, Serum: 1.6 ng/mL (ref 0.0–4.0)

## 2020-11-02 NOTE — Progress Notes (Signed)
Good morning Jeffrey Barton. Your platelet count has decreased from 143 to 137. I would like for you to come in for some blood work so we can test your for a clotting disorder. And then further refer you to hematology if needed based on your results. Your Hgb A1c is 5.5 which is within normal range. Your kidney function is also increased and GFR is at 56. Are you drinking a lot of water? Make sure you are staying hydrated at work. Your TC is 235, we want this below 200 and your LDL is 158 we want this below 100. Want you to work on your diet and exercise and lifestyle modification. Cut down on fatty foods and saturated fats. Avoid fast food and red meats. Exercise 4-5 days for at least 30-45 min. We will recheck on your next visit and add medication if needed at that time.

## 2020-11-02 NOTE — Progress Notes (Signed)
Your PSA was also within the normal range.

## 2020-12-21 ENCOUNTER — Other Ambulatory Visit: Payer: Self-pay | Admitting: Nurse Practitioner

## 2020-12-27 ENCOUNTER — Encounter: Payer: Self-pay | Admitting: Internal Medicine

## 2020-12-28 ENCOUNTER — Telehealth: Payer: Self-pay
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
## 2020-12-29 ENCOUNTER — Encounter: Payer: Self-pay | Admitting: Nurse Practitioner

## 2020-12-29 ENCOUNTER — Other Ambulatory Visit: Payer: Self-pay

## 2020-12-29 ENCOUNTER — Ambulatory Visit (INDEPENDENT_AMBULATORY_CARE_PROVIDER_SITE_OTHER): Payer: BC Managed Care – PPO | Admitting: Nurse Practitioner

## 2020-12-29 VITALS — BP 118/76 | HR 75 | Temp 100.0°F | Ht 70.0 in | Wt 252.0 lb

## 2020-12-29 DIAGNOSIS — M545 Low back pain, unspecified: Secondary | ICD-10-CM

## 2020-12-29 DIAGNOSIS — M542 Cervicalgia: Secondary | ICD-10-CM

## 2020-12-29 DIAGNOSIS — M791 Myalgia, unspecified site: Secondary | ICD-10-CM

## 2020-12-29 DIAGNOSIS — R509 Fever, unspecified: Secondary | ICD-10-CM | POA: Diagnosis not present

## 2020-12-29 MED ORDER — NAPROXEN 250 MG PO TABS: 250.0000 mg | ORAL_TABLET | Freq: Two times a day (BID) | ORAL | 1 refills | Status: DC

## 2020-12-29 MED ORDER — CYCLOBENZAPRINE HCL 10 MG PO TABS: 10.0000 mg | ORAL_TABLET | Freq: Three times a day (TID) | ORAL | 0 refills | Status: DC | PRN
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
## 2021-01-14 ENCOUNTER — Other Ambulatory Visit: Payer: Self-pay | Admitting: Nurse Practitioner

## 2021-01-14 DIAGNOSIS — M545 Low back pain, unspecified: Secondary | ICD-10-CM

## 2021-01-14 DIAGNOSIS — M542 Cervicalgia: Secondary | ICD-10-CM

## 2021-01-14 DIAGNOSIS — M791 Myalgia, unspecified site: Secondary | ICD-10-CM

## 2021-01-21 LAB — POC COVID19 BINAXNOW: SARS Coronavirus 2 Ag: NEGATIVE

## 2021-01-30 ENCOUNTER — Other Ambulatory Visit: Payer: Self-pay | Admitting: Internal Medicine

## 2021-01-30 ENCOUNTER — Other Ambulatory Visit: Payer: Self-pay | Admitting: Nurse Practitioner

## 2021-01-30 DIAGNOSIS — M791 Myalgia, unspecified site: Secondary | ICD-10-CM

## 2021-01-30 DIAGNOSIS — M542 Cervicalgia: Secondary | ICD-10-CM

## 2021-01-30 DIAGNOSIS — M545 Low back pain, unspecified: Secondary | ICD-10-CM

## 2021-01-31 NOTE — Telephone Encounter (Signed)
Cyclobenzaprine refill 

## 2021-02-05 ENCOUNTER — Other Ambulatory Visit: Payer: Self-pay

## 2021-02-05 DIAGNOSIS — M791 Myalgia, unspecified site: Secondary | ICD-10-CM

## 2021-02-05 DIAGNOSIS — M545 Low back pain, unspecified: Secondary | ICD-10-CM

## 2021-02-05 DIAGNOSIS — M542 Cervicalgia: Secondary | ICD-10-CM

## 2021-02-05 MED ORDER — CYCLOBENZAPRINE HCL 10 MG PO TABS: ORAL_TABLET | ORAL | 0 refills | Status: DC

## 2021-02-07 ENCOUNTER — Ambulatory Visit: Payer: BC Managed Care – PPO | Admitting: Family Medicine

## 2021-02-07 ENCOUNTER — Encounter: Payer: Self-pay | Admitting: Family Medicine

## 2021-02-07 VITALS — BP 122/86 | HR 98 | Ht 69.0 in | Wt 260.0 lb

## 2021-02-07 DIAGNOSIS — G4733 Obstructive sleep apnea (adult) (pediatric): Secondary | ICD-10-CM

## 2021-02-07 NOTE — Patient Instructions (Addendum)
Please continue using your BiPAP regularly. While your insurance requires that you use BiPAP at least 4 hours each night on 70% of the nights, I recommend, that you not skip any nights and use it throughout the night if you can. Getting used to BiPAP and staying with the treatment long term does take time and patience and discipline. Untreated obstructive sleep apnea when it is moderate to severe can have an adverse impact on cardiovascular health and raise her risk for heart disease, arrhythmias, hypertension, congestive heart failure, stroke and diabetes. Untreated obstructive sleep apnea causes sleep disruption, nonrestorative sleep, and sleep deprivation. This can have an impact on your day to day functioning and cause daytime sleepiness and impairment of cognitive function, memory loss, mood disturbance, and problems focussing. Using BiPAP regularly can improve these symptoms.   Follow up in 3 months   DME: [facility] Phone: [phone], press option 1 Fax: [phone]   Sleep Apnea Sleep apnea affects breathing during sleep. It causes breathing to stop for a short time or to become shallow. It can also increase the risk of:  Heart attack.  Stroke.  Being very overweight (obese).  Diabetes.  Heart failure.  Irregular heartbeat. The goal of treatment is to help you breathe normally again. What are the causes? There are three kinds of sleep apnea:  Obstructive sleep apnea. This is caused by a blocked or collapsed airway.  Central sleep apnea. This happens when the brain does not send the right signals to the muscles that control breathing.  Mixed sleep apnea. This is a combination of obstructive and central sleep apnea. The most common cause of this condition is a collapsed or blocked airway. This can happen if:  Your throat muscles are too relaxed.  Your tongue and tonsils are too large.  You are overweight.  Your airway is too small.   What increases the  risk?  Being overweight.  Smoking.  Having a small airway.  Being older.  Being male.  Drinking alcohol.  Taking medicines to calm yourself (sedatives or tranquilizers).  Having family members with the condition. What are the signs or symptoms?  Trouble staying asleep.  Being sleepy or tired during the day.  Getting angry a lot.  Loud snoring.  Headaches in the morning.  Not being able to focus your mind (concentrate).  Forgetting things.  Less interest in sex.  Mood swings.  Personality changes.  Feelings of sadness (depression).  Waking up a lot during the night to pee (urinate).  Dry mouth.  Sore throat. How is this diagnosed?  Your medical history.  A physical exam.  A test that is done when you are sleeping (sleep study). The test is most often done in a sleep lab but may also be done at home. How is this treated?  Sleeping on your side.  Using a medicine to get rid of mucus in your nose (decongestant).  Avoiding the use of alcohol, medicines to help you relax, or certain pain medicines (narcotics).  Losing weight, if needed.  Changing your diet.  Not smoking.  Using a machine to open your airway while you sleep, such as: ? An oral appliance. This is a mouthpiece that shifts your lower jaw forward. ? A CPAP device. This device blows air through a mask when you breathe out (exhale). ? An EPAP device. This has valves that you put in each nostril. ? A BPAP device. This device blows air through a mask when you breathe in (inhale)  and breathe out.  Having surgery if other treatments do not work. It is important to get treatment for sleep apnea. Without treatment, it can lead to:  High blood pressure.  Coronary artery disease.  In men, not being able to have an erection (impotence).  Reduced thinking ability.   Follow these instructions at home: Lifestyle  Make changes that your doctor recommends.  Eat a healthy diet.  Lose  weight if needed.  Avoid alcohol, medicines to help you relax, and some pain medicines.  Do not use any products that contain nicotine or tobacco, such as cigarettes, e-cigarettes, and chewing tobacco. If you need help quitting, ask your doctor. General instructions  Take over-the-counter and prescription medicines only as told by your doctor.  If you were given a machine to use while you sleep, use it only as told by your doctor.  If you are having surgery, make sure to tell your doctor you have sleep apnea. You may need to bring your device with you.  Keep all follow-up visits as told by your doctor. This is important. Contact a doctor if:  The machine that you were given to use during sleep bothers you or does not seem to be working.  You do not get better.  You get worse. Get help right away if:  Your chest hurts.  You have trouble breathing in enough air.  You have an uncomfortable feeling in your back, arms, or stomach.  You have trouble talking.  One side of your body feels weak.  A part of your face is hanging down. These symptoms may be an emergency. Do not wait to see if the symptoms will go away. Get medical help right away. Call your local emergency services (911 in the [place] Do not drive yourself to the hospital. Summary  This condition affects breathing during sleep.  The most common cause is a collapsed or blocked airway.  The goal of treatment is to help you breathe normally while you sleep. This information is not intended to replace advice given to you by your health care provider. Make sure you discuss any questions you have with your health care provider. Document Revised: 08/28/2018 Document Reviewed: 07/07/2018 Elsevier Patient Education  2021 [employer].
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
## 2021-03-19 ENCOUNTER — Encounter: Payer: Self-pay | Admitting: Nurse Practitioner

## 2021-03-19 ENCOUNTER — Ambulatory Visit (INDEPENDENT_AMBULATORY_CARE_PROVIDER_SITE_OTHER): Payer: BC Managed Care – PPO | Admitting: Nurse Practitioner

## 2021-03-19 ENCOUNTER — Other Ambulatory Visit: Payer: Self-pay

## 2021-03-19 VITALS — BP 124/80 | HR 85 | Temp 98.8°F | Ht 66.8 in | Wt 255.6 lb

## 2021-03-19 DIAGNOSIS — M25512 Pain in left shoulder: Secondary | ICD-10-CM | POA: Diagnosis not present

## 2021-03-19 DIAGNOSIS — M545 Low back pain, unspecified: Secondary | ICD-10-CM

## 2021-03-19 DIAGNOSIS — M542 Cervicalgia: Secondary | ICD-10-CM | POA: Diagnosis not present

## 2021-03-19 MED ORDER — PREDNISONE 10 MG (21) PO TBPK: ORAL_TABLET | ORAL | 0 refills | Status: DC

## 2021-03-19 MED ORDER — CYCLOBENZAPRINE HCL 10 MG PO TABS: ORAL_TABLET | ORAL | 0 refills | Status: DC

## 2021-03-19 MED ORDER — DICLOFENAC SODIUM 1 % EX GEL
2.0000 g | Freq: Four times a day (QID) | CUTANEOUS | 2 refills | Status: DC
Start: 2021-03-19 — End: 2021-12-26
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
## 2021-04-06 ENCOUNTER — Other Ambulatory Visit: Payer: Self-pay | Admitting: Nurse Practitioner

## 2021-04-06 DIAGNOSIS — M25512 Pain in left shoulder: Secondary | ICD-10-CM

## 2021-04-06 DIAGNOSIS — M542 Cervicalgia: Secondary | ICD-10-CM

## 2021-04-06 DIAGNOSIS — M545 Low back pain, unspecified: Secondary | ICD-10-CM

## 2021-04-09 ENCOUNTER — Other Ambulatory Visit: Payer: Self-pay

## 2021-04-09 DIAGNOSIS — M25512 Pain in left shoulder: Secondary | ICD-10-CM

## 2021-04-09 DIAGNOSIS — M542 Cervicalgia: Secondary | ICD-10-CM

## 2021-04-09 DIAGNOSIS — M545 Low back pain, unspecified: Secondary | ICD-10-CM

## 2021-04-09 MED ORDER — CYCLOBENZAPRINE HCL 10 MG PO TABS: ORAL_TABLET | ORAL | 0 refills | Status: DC

## 2021-04-11 IMAGING — US US ABDOMEN LIMITED
1 series · 14 of 25 positions shown · non-contrast
Comparison: None.

CLINICAL DATA: Elevated liver function tests.

EXAM:
ULTRASOUND ABDOMEN LIMITED RIGHT UPPER QUADRANT

[Series 1: us abdomen limited · 0.26mm/px · 14 of 72 slices shown]
[im 1/72]
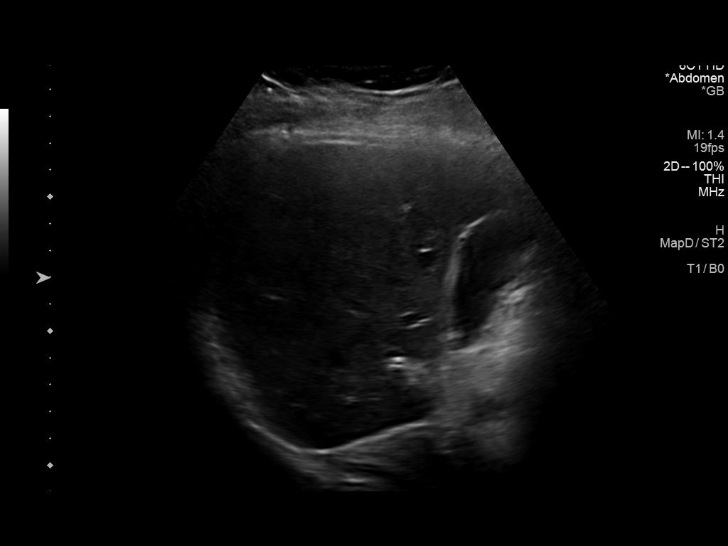
[im 6/72]
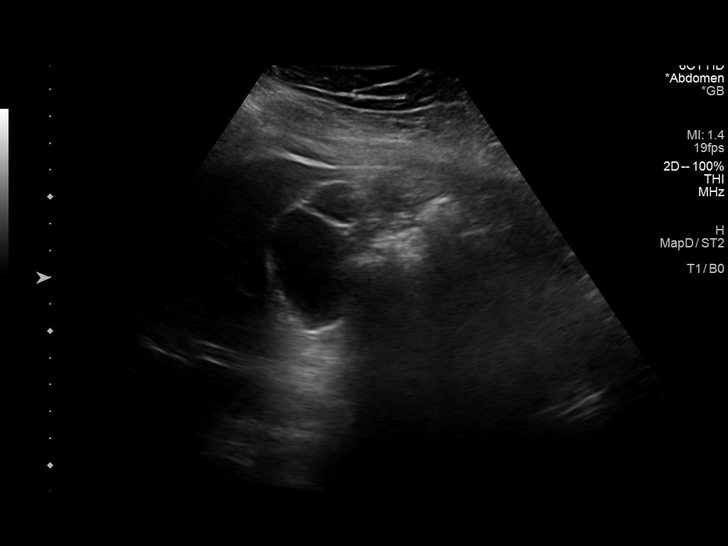
[im 12/72]
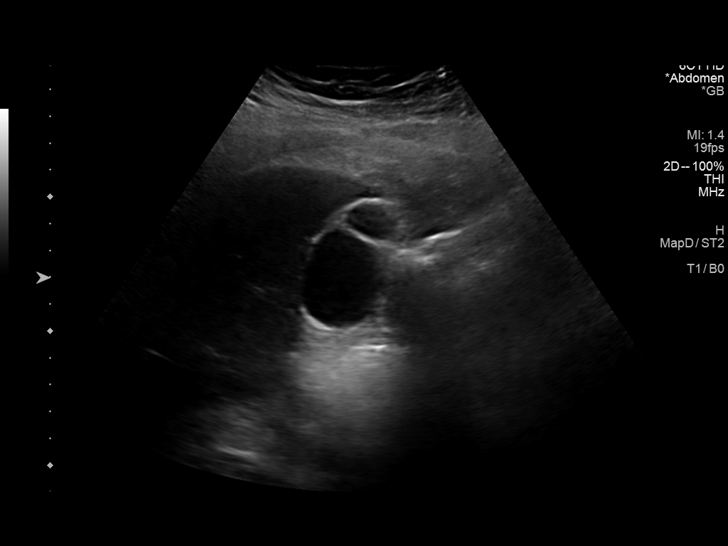
[im 18/72]
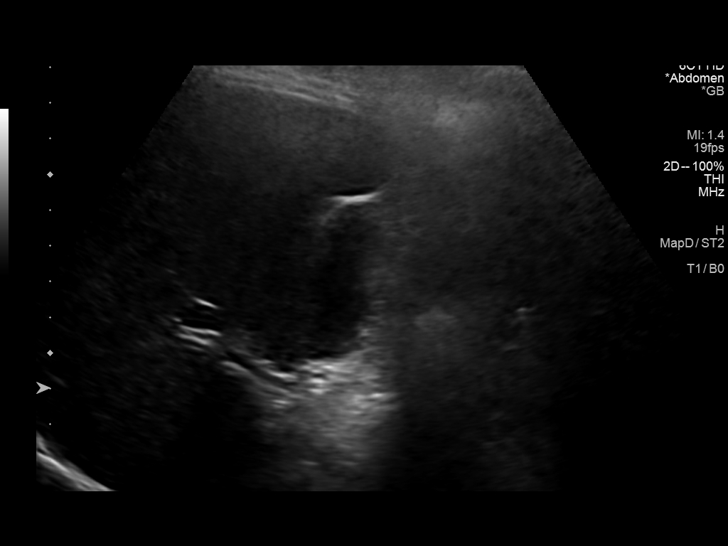
[im 24/72]
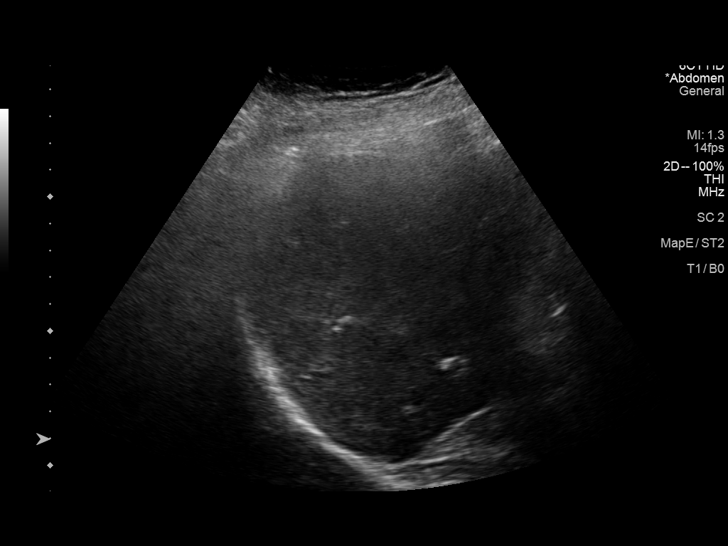
[im 27/72]
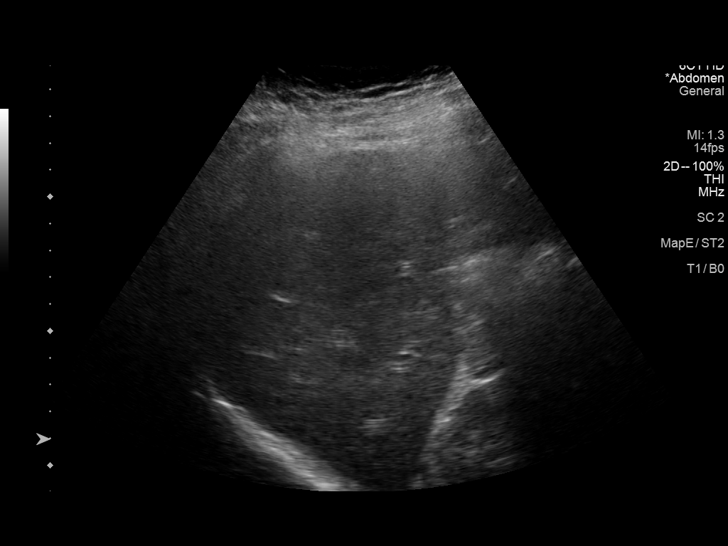
[im 33/72]
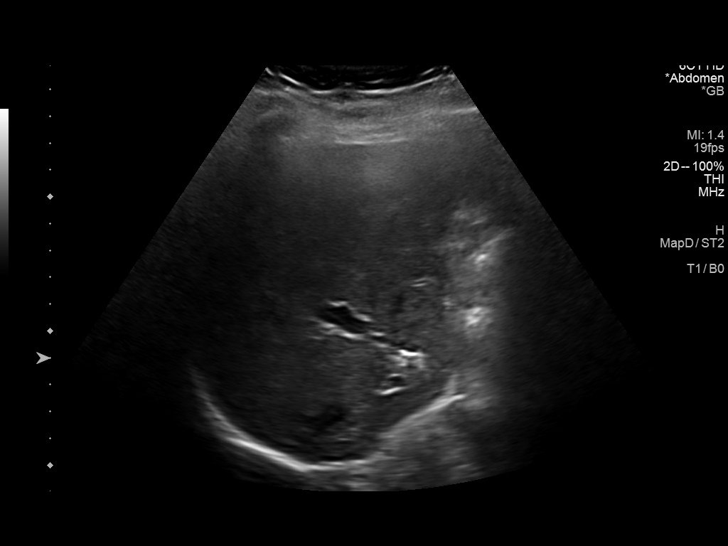
[im 39/72]
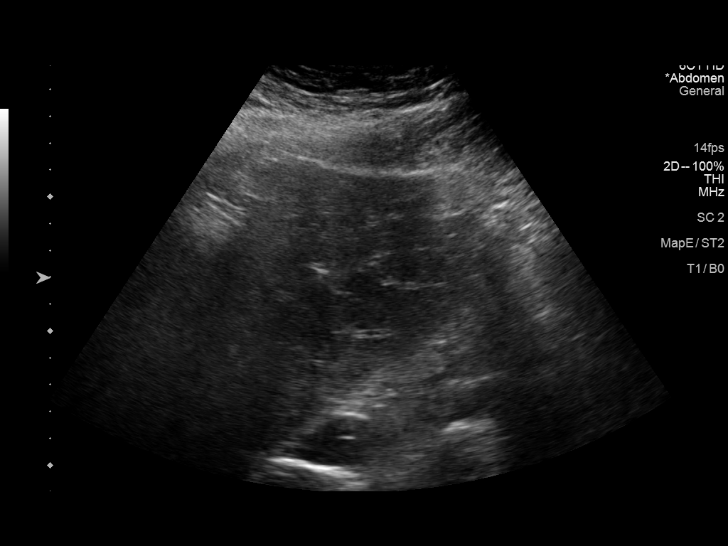
[im 45/72]
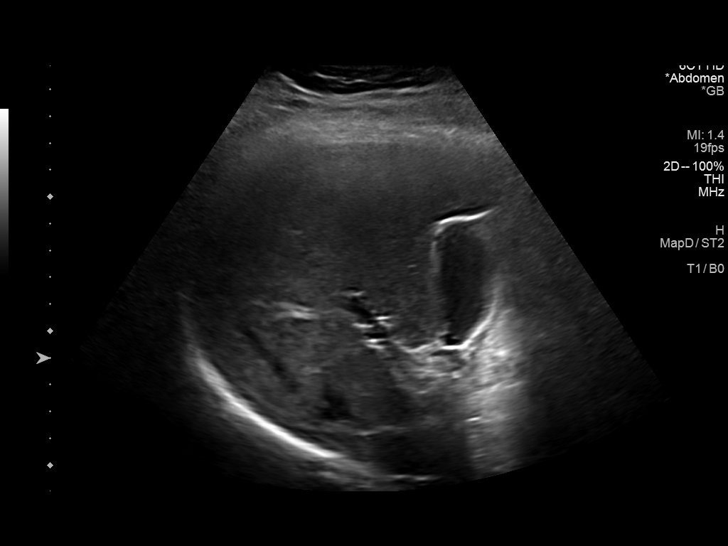
[im 48/72]
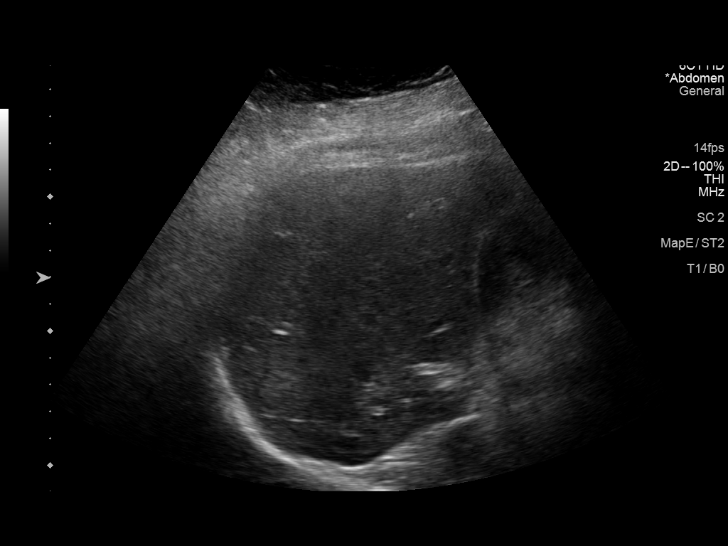
[im 54/72]
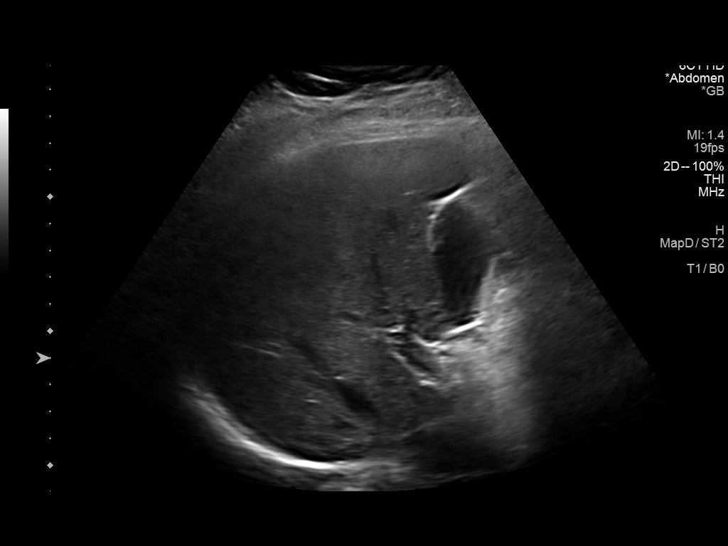
[im 60/72]
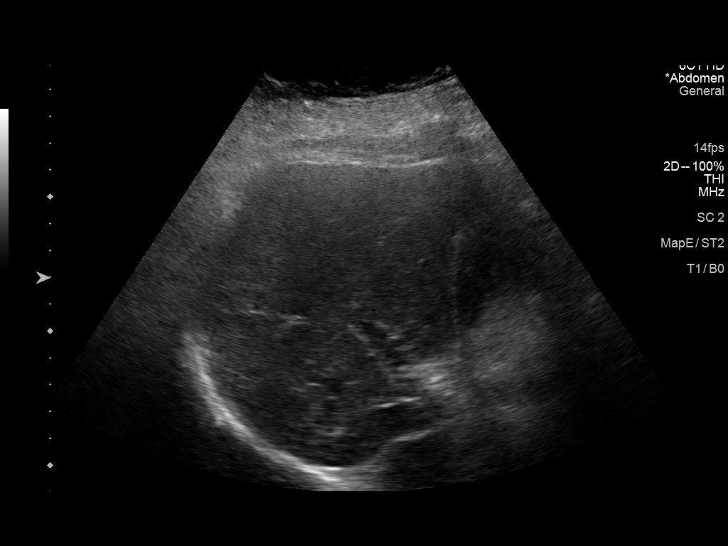
[im 66/72]
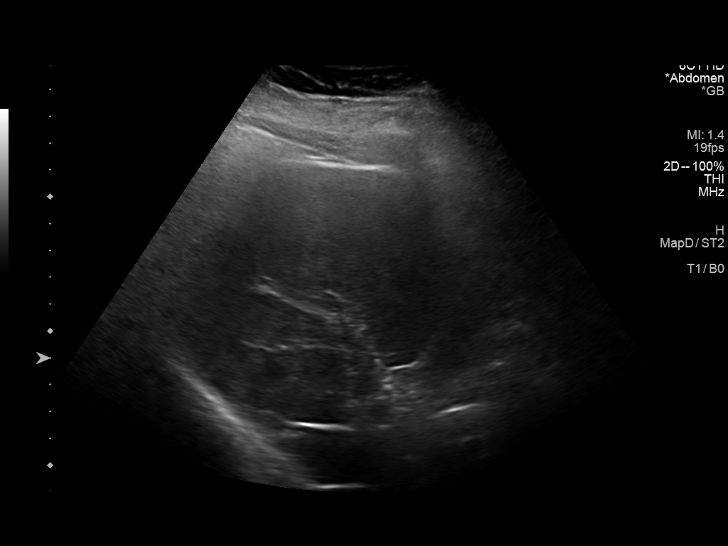
[im 72/72]
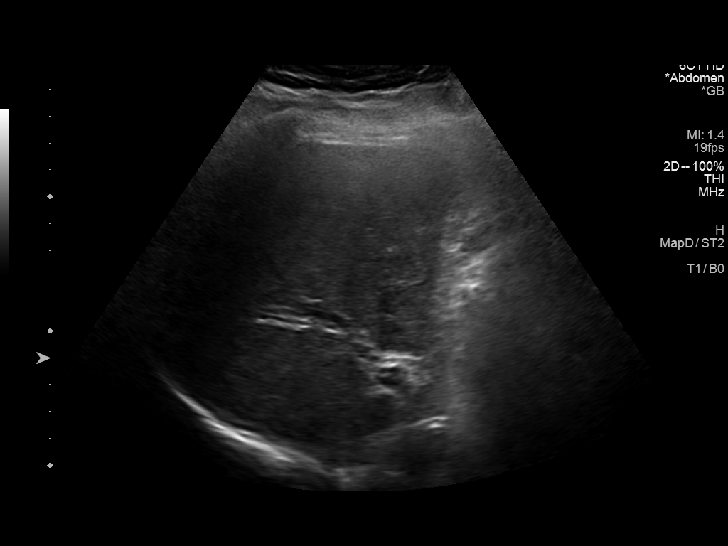

[14 of 25 positions shown; findings below may reference images not displayed]

FINDINGS: Gallbladder:

No gallstones or wall thickening visualized. No sonographic Murphy
sign noted by sonographer.

Common bile duct:

Diameter: 3 mm, within normal limits.

Liver:

Slightly increased in echogenicity diffusely. Margin may be
minimally irregular. Portal vein is patent on color Doppler imaging
with normal direction of blood flow towards the liver.

Other: None.
IMPRESSION: Liver appears steatotic and possibly cirrhotic.
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
## 2021-05-16 ENCOUNTER — Ambulatory Visit: Payer: BC Managed Care – PPO | Admitting: Family Medicine

## 2021-08-27 DIAGNOSIS — Z0271 Encounter for disability determination: Secondary | ICD-10-CM

## 2021-08-31 ENCOUNTER — Encounter: Payer: Self-pay | Admitting: Nurse Practitioner

## 2021-08-31 ENCOUNTER — Telehealth: Payer: Self-pay

## 2021-08-31 NOTE — Telephone Encounter (Signed)
The patient was notified that his DMV forms have been faxed, a copy has been kept for his chart and the original has been placed up front for pickup.

## 2021-09-10 ENCOUNTER — Ambulatory Visit: Payer: BC Managed Care – PPO | Admitting: Family Medicine

## 2021-10-05 ENCOUNTER — Encounter: Payer: Self-pay | Admitting: Nurse Practitioner

## 2021-10-16 ENCOUNTER — Ambulatory Visit: Payer: BC Managed Care – PPO | Admitting: Nurse Practitioner

## 2021-10-16 ENCOUNTER — Other Ambulatory Visit: Payer: Self-pay

## 2021-10-16 ENCOUNTER — Telehealth: Payer: Self-pay

## 2021-10-16 VITALS — Ht 66.8 in

## 2021-10-16 DIAGNOSIS — G4733 Obstructive sleep apnea (adult) (pediatric): Secondary | ICD-10-CM

## 2021-10-16 NOTE — Telephone Encounter (Signed)
The pt was scheduled a nurse visit appt  so that the office can get his pulse ox level.

## 2021-10-16 NOTE — Progress Notes (Signed)
The patient is here today to get his pulse oxygen read.

## 2021-10-22 ENCOUNTER — Telehealth: Payer: Self-pay

## 2021-10-22 NOTE — Telephone Encounter (Signed)
The pt was notified that his DMV forms were faxed on 10/17/2021, the original with the fax confirmation is ready for pickup. A copy has been kept for the pt's chart.

## 2021-10-23 ENCOUNTER — Ambulatory Visit: Payer: BC Managed Care – PPO

## 2021-11-06 ENCOUNTER — Encounter: Payer: BC Managed Care – PPO | Admitting: Nurse Practitioner

## 2021-11-13 ENCOUNTER — Encounter: Payer: BC Managed Care – PPO | Admitting: Nurse Practitioner

## 2021-11-28 ENCOUNTER — Encounter: Payer: BC Managed Care – PPO | Admitting: Nurse Practitioner

## 2021-12-25 ENCOUNTER — Telehealth: Payer: Self-pay

## 2021-12-25 NOTE — Patient Instructions (Addendum)
Please continue using your BiPAP regularly. While your insurance requires that you use BiPAP at least 4 hours each night on 70% of the nights, I recommend, that you not skip any nights and use it throughout the night if you can. Getting used to BiPAP and staying with the treatment long term does take time and patience and discipline. Untreated obstructive sleep apnea when it is moderate to severe can have an adverse impact on cardiovascular health and raise her risk for heart disease, arrhythmias, hypertension, congestive heart failure, stroke and diabetes. Untreated obstructive sleep apnea causes sleep disruption, nonrestorative sleep, and sleep deprivation. This can have an impact on your day to day functioning and cause daytime sleepiness and impairment of cognitive function, memory loss, mood disturbance, and problems focussing. Using BiPAP regularly can improve these symptoms.  Please contact [facility] to see if they can looks at your BiPAP and help [place] get a download. Once I receive download I will update notes.   DME: [facility] Phone: [phone]  Follow up pending download review
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
## 2021-12-25 NOTE — Telephone Encounter (Signed)
LVM for pt to bring cpap/power cord or SD card to appt. W Amy tomorrow.

## 2021-12-26 ENCOUNTER — Ambulatory Visit: Payer: 59 | Admitting: Family Medicine

## 2021-12-26 ENCOUNTER — Other Ambulatory Visit: Payer: Self-pay

## 2021-12-26 ENCOUNTER — Encounter: Payer: Self-pay | Admitting: Family Medicine

## 2021-12-26 VITALS — BP 118/80 | HR 70 | Ht 70.0 in | Wt 243.0 lb

## 2021-12-26 DIAGNOSIS — G4733 Obstructive sleep apnea (adult) (pediatric): Secondary | ICD-10-CM | POA: Diagnosis not present

## 2021-12-26 NOTE — Progress Notes (Addendum)
CM sent to [facility] for new orders  Kathy Breach, Axton Cihlar, CMA; Dimas Millin  Patient is scheduled to come in for a compliance download, but it doesn't look like he is using his machine at all.

## 2022-02-27 ENCOUNTER — Ambulatory Visit: Payer: 59 | Admitting: Nurse Practitioner

## 2022-02-27 ENCOUNTER — Encounter: Payer: Self-pay | Admitting: Nurse Practitioner

## 2022-02-27 VITALS — BP 122/60 | Temp 100.2°F | Ht 70.0 in | Wt 245.0 lb

## 2022-02-27 DIAGNOSIS — D691 Qualitative platelet defects: Secondary | ICD-10-CM

## 2022-02-27 DIAGNOSIS — E7800 Pure hypercholesterolemia, unspecified: Secondary | ICD-10-CM

## 2022-02-27 DIAGNOSIS — M79672 Pain in left foot: Secondary | ICD-10-CM | POA: Diagnosis not present

## 2022-02-27 DIAGNOSIS — Z6835 Body mass index (BMI) 35.0-35.9, adult: Secondary | ICD-10-CM

## 2022-02-27 DIAGNOSIS — M79671 Pain in right foot: Secondary | ICD-10-CM

## 2022-02-27 DIAGNOSIS — E6609 Other obesity due to excess calories: Secondary | ICD-10-CM

## 2022-02-27 NOTE — Progress Notes (Signed)
?Industrial/product designer as a Education administrator for [employer], FNP.,have documented all relevant documentation on the behalf of Minette Brine, FNP,as directed by  Minette Brine, FNP while in the presence of Minette Brine, [place]. ? ?This visit occurred during the SARS-CoV-2 public health emergency.  Safety protocols were in place, including screening questions prior to the visit, additional usage of staff PPE, and extensive cleaning of exam room while observing appropriate contact time as indicated for disinfecting solutions. ? ?Subjective:  ?  ? Patient ID: Jeffrey Barton , male    DOB: 1961-07-22 , 61 y.o.   MRN: [id number] ? ? ?Chief Complaint  ?Patient presents with  ? Foot Pain  ? ? ?HPI ? ?Patient presents today for foot pain. He went to the [place] to try to make some claims, he has been having toenail problems since being in the [employer]. He has pain to his toenails.  Does not go to nail shop. He will try to cut his nails.  He reports he generally has a 101 temperature. He was in the [employer] for 4 years.  ? ?Foot Pain ?This is a chronic problem. The current episode started more than 1 year ago (20 years ago). The problem occurs constantly. Pertinent negatives include no abdominal pain, chills or coughing. He has tried nothing for the symptoms.   ? ?History reviewed. No pertinent past medical history.  ? ?Family History  ?Problem Relation Age of Onset  ? Healthy Mother   ? ? ? ?Current Outpatient Medications:  ?  Acetaminophen (TYLENOL 8 HOUR PO), Take by mouth., Disp: , Rfl:   ? ?No Known Allergies  ? ?Review of Systems  ?Constitutional: Negative.  Negative for chills.  ?Respiratory: Negative.  Negative for cough.   ?Cardiovascular: Negative.   ?Gastrointestinal: Negative.  Negative for abdominal pain.  ?Neurological: Negative.   ?Psychiatric/Behavioral: Negative.     ? ?Today's Vitals  ? 02/27/22 1537  ?BP: 122/60  ?Temp: 100.2 ?F (37.9 ?C)  ?TempSrc: Oral  ?Weight: 245 lb (111.1 kg)  ?Height: 5' 10" (1.778 m)   ? ?Body mass index is 35.15 kg/m?.  ?Wt Readings from Last 3 Encounters:  ?02/27/22 245 lb (111.1 kg)  ?12/26/21 243 lb (110.2 kg)  ?03/19/21 255 lb 9.6 oz (115.9 kg)  ? ? ?Objective:  ?Physical Exam ?Constitutional:   ?   General: He is not in acute distress. ?   Appearance: Normal appearance.  ?Cardiovascular:  ?   Rate and Rhythm: Normal rate and regular rhythm.  ?   Pulses: Normal pulses.  ?   Heart sounds: Normal heart sounds. No murmur heard. ?Pulmonary:  ?   Effort: Pulmonary effort is normal. No respiratory distress.  ?   Breath sounds: Normal breath sounds. No wheezing.  ?Neurological:  ?   General: No focal deficit present.  ?   Mental Status: He is alert and oriented to person, place, and time.  ?   Cranial Nerves: No cranial nerve deficit.  ?   Motor: No weakness.  ?Psychiatric:     ?   Mood and Affect: Mood normal.     ?   Behavior: Behavior normal.     ?   Thought Content: Thought content normal.     ?   Judgment: Judgment normal.  ?  ? ?   ?Assessment And Plan:  ?   ?1. Pain in both feet ?Comments: Will refer to podiatry for further evaluation ?- Ambulatory referral to Podiatry ? ?2. Elevated cholesterol ?- Lipid  panel ?- CMP14+EGFR ? ?3. Abnormal platelets (HCC) ?- CBC with Differential/Platelet ? ?4. Class 2 obesity due to excess calories without serious comorbidity with body mass index (BMI) of 35.0 to 35.9 in adult ? He is encouraged to initially strive for BMI less than 30 to decrease cardiac risk. He is advised to exercise no less than 150 minutes per week.  ? ? ? ?Patient was given opportunity to ask questions. Patient verbalized understanding of the plan and was able to repeat key elements of the plan. All questions were answered to their satisfaction.  ?Minette Brine, FNP  ? ?I, Minette Brine, FNP, have reviewed all documentation for this visit. The documentation on 02/27/22 for the exam, diagnosis, procedures, and orders are all accurate and complete.  ? ?IF YOU HAVE BEEN REFERRED TO A  SPECIALIST, IT MAY TAKE 1-2 WEEKS TO SCHEDULE/PROCESS THE REFERRAL. IF YOU HAVE NOT HEARD FROM US/SPECIALIST IN TWO WEEKS, PLEASE GIVE [place] A CALL AT [phone].  ? ?THE PATIENT IS ENCOURAGED TO PRACTICE SOCIAL DISTANCING DUE TO THE COVID-19 PANDEMIC.   ?

## 2022-02-27 NOTE — Patient Instructions (Signed)
Foot Pain °Many things can cause foot pain. Some common causes are: °An injury. °A sprain. °Arthritis. °Blisters. °Bunions. °Follow these instructions at home: °Managing pain, stiffness, and swelling °If directed, put ice on the painful area: °Put ice in a plastic bag. °Place a towel between your skin and the bag. °Leave the ice on for 20 minutes, 2-3 times a day. ° °Activity °Do not stand or walk for long periods. °Return to your normal activities as told by your health care provider. Ask your health care provider what activities are safe for you. °Do stretches to relieve foot pain and stiffness as told by your health care provider. °Do not lift anything that is heavier than 10 lb (4.5 kg), or the limit that you are told, until your health care provider says that it is safe. Lifting a lot of weight can put added pressure on your feet. °Lifestyle °Wear comfortable, supportive shoes that fit you well. Do not wear high heels. °Keep your feet clean and dry. °General instructions °Take over-the-counter and prescription medicines only as told by your health care provider. °Rub your foot gently. °Pay attention to any changes in your symptoms. °Keep all follow-up visits as told by your health care provider. This is important. °Contact a health care provider if: °Your pain does not get better after a few days of self-care. °Your pain gets worse. °You cannot stand on your foot. °Get help right away if: °Your foot is numb or tingling. °Your foot or toes are swollen. °Your foot or toes turn white or blue. °You have warmth and redness along your foot. °Summary °Common causes of foot pain are injury, sprain, arthritis, blisters, or bunions. °Ice, medicines, and comfortable shoes may help foot pain. °Contact your health care provider if your pain does not get better after a few days of self-care. °This information is not intended to replace advice given to you by your health care provider. Make sure you discuss any questions you  have with your health care provider. °Document Revised: 02/14/2021 Document Reviewed: 02/14/2021 °Elsevier Patient Education © 2022 Elsevier Inc. ° °

## 2022-03-04 LAB — LIPID PANEL
Chol/HDL Ratio: 3.5 ratio (ref 0.0–5.0)
Cholesterol, Total: 201 mg/dL — ABNORMAL HIGH (ref 100–199)
HDL: 57 mg/dL (ref >39)
LDL Chol Calc (NIH): 117 mg/dL — ABNORMAL HIGH (ref 0–99)
Triglycerides: 152 mg/dL — ABNORMAL HIGH (ref 0–149)
VLDL Cholesterol Cal: 27 mg/dL (ref 5–40)

## 2022-03-04 LAB — CBC WITH DIFFERENTIAL/PLATELET
Basophils Absolute: 0 10*3/uL (ref 0.0–0.2)
Basos: 1 %
EOS (ABSOLUTE): 0 10*3/uL (ref 0.0–0.4)
Eos: 1 %
Hematocrit: 48.1 % (ref 37.5–51.0)
Hemoglobin: 16 g/dL (ref 13.0–17.7)
Immature Grans (Abs): 0 10*3/uL (ref 0.0–0.1)
Immature Granulocytes: 0 %
Lymphocytes Absolute: 1.1 10*3/uL (ref 0.7–3.1)
Lymphs: 16 %
MCH: 30.1 pg (ref 26.6–33.0)
MCHC: 33.3 g/dL (ref 31.5–35.7)
MCV: 91 fL (ref 79–97)
Monocytes Absolute: 0.5 10*3/uL (ref 0.1–0.9)
Monocytes: 7 %
Neutrophils Absolute: 5.3 10*3/uL (ref 1.4–7.0)
Neutrophils: 75 %
Platelets: 137 10*3/uL — ABNORMAL LOW (ref 150–450)
RBC: 5.31 x10E6/uL (ref 4.14–5.80)
RDW: 12.6 % (ref 11.6–15.4)
WBC: 7 10*3/uL (ref 3.4–10.8)

## 2022-03-04 LAB — CMP14+EGFR
ALT: 22 IU/L (ref 0–44)
AST: 22 IU/L (ref 0–40)
Albumin/Globulin Ratio: 1.5 (ref 1.2–2.2)
Albumin: 4.3 g/dL (ref 3.8–4.9)
Alkaline Phosphatase: 87 IU/L (ref 44–121)
BUN/Creatinine Ratio: 13 (ref 9–20)
BUN: 15 mg/dL (ref 6–24)
Bilirubin Total: 0.4 mg/dL (ref 0.0–1.2)
CO2: 26 mmol/L (ref 20–29)
Calcium: 9.7 mg/dL (ref 8.7–10.2)
Chloride: 110 mmol/L — ABNORMAL HIGH (ref 96–106)
Creatinine, Ser: 1.14 mg/dL (ref 0.76–1.27)
Globulin, Total: 2.9 g/dL (ref 1.5–4.5)
Glucose: 81 mg/dL (ref 70–99)
Potassium: 4.1 mmol/L (ref 3.5–5.2)
Sodium: 148 mmol/L — ABNORMAL HIGH (ref 134–144)
Total Protein: 7.2 g/dL (ref 6.0–8.5)
eGFR: 74 mL/min/{1.73_m2} (ref >59)

## 2022-03-12 ENCOUNTER — Ambulatory Visit: Payer: 59

## 2022-03-12 ENCOUNTER — Ambulatory Visit: Payer: 59 | Admitting: Podiatry

## 2022-03-12 DIAGNOSIS — M79672 Pain in left foot: Secondary | ICD-10-CM | POA: Diagnosis not present

## 2022-03-12 DIAGNOSIS — Z79899 Other long term (current) drug therapy: Secondary | ICD-10-CM | POA: Diagnosis not present

## 2022-03-12 DIAGNOSIS — M79671 Pain in right foot: Secondary | ICD-10-CM

## 2022-03-12 NOTE — Patient Instructions (Signed)
Terbinafine Tablets What is this medication? TERBINAFINE (TER bin a feen) treats fungal infections of the nails. It belongs to a group of medications called antifungals. It will not treat infections caused by bacteria or viruses. This medicine may be used for other purposes; ask your health care provider or pharmacist if you have questions. COMMON BRAND NAME(S): Lamisil, Terbinex What should I tell my care team before I take this medication? They need to know if you have any of these conditions: Liver disease An unusual or allergic reaction to terbinafine, other medications, foods, dyes, or preservatives Pregnant or trying to get pregnant Breast-feeding How should I use this medication? Take this medication by mouth with water. Take it as directed on the prescription label at the same time every day. You can take it with or without food. If it upsets your stomach, take it with food. Keep taking it unless your care team tells you to stop. A special MedGuide will be given to you by the pharmacist with each prescription and refill. Be sure to read this information carefully each time. Talk to your care team regarding the use of this medication in children. Special care may be needed. Overdosage: If you think you have taken too much of this medicine contact a poison control center or emergency room at once. NOTE: This medicine is only for you. Do not share this medicine with others. What if I miss a dose? If you miss a dose, take it as soon as you can unless it is more than 4 hours late. If it is more than 4 hours late, skip the missed dose. Take the next dose at the normal time. What may interact with this medication? Do not take this medication with any of the following: Pimozide Thioridazine This medication may also interact with the following: Beta blockers Caffeine Certain medications for mental health conditions Cimetidine Cyclosporine Medications for fungal infections like fluconazole  and ketoconazole Medications for irregular heartbeat like amiodarone, flecainide and propafenone Rifampin Warfarin This list may not describe all possible interactions. Give your health care provider a list of all the medicines, herbs, non-prescription drugs, or dietary supplements you use. Also tell them if you smoke, drink alcohol, or use illegal drugs. Some items may interact with your medicine. What should I watch for while using this medication? Visit your care team for regular checks on your progress. You may need blood work while you are taking this medication. It may be some time before you see the benefit from this medication. This medication may cause serious skin reactions. They can happen weeks to months after starting the medication. Contact your care team right away if you notice fevers or flu-like symptoms with a rash. The rash may be red or purple and then turn into blisters or peeling of the skin. Or, you might notice a red rash with swelling of the face, lips or lymph nodes in your neck or under your arms. This medication can make you more sensitive to the sun. Keep out of the sun, If you cannot avoid being in the sun, wear protective clothing and sunscreen. Do not use sun lamps or tanning beds/booths. What side effects may I notice from receiving this medication? Side effects that you should report to your care team as soon as possible: Allergic reactions--skin rash, itching, hives, swelling of the face, lips, tongue, or throat Change in sense of smell Change in taste Infection--fever, chills, cough, or sore throat Liver injury--right upper belly pain, loss of appetite, nausea,   light-colored stool, dark yellow or brown urine, yellowing skin or eyes, unusual weakness or fatigue Low red blood cell level--unusual weakness or fatigue, dizziness, headache, trouble breathing Lupus-like syndrome--joint pain, swelling, or stiffness, butterfly-shaped rash on the face, rashes that get worse  in the sun, fever, unusual weakness or fatigue Rash, fever, and swollen lymph nodes Redness, blistering, peeling, or loosening of the skin, including inside the mouth Unusual bruising or bleeding Worsening mood, feelings of depression Side effects that usually do not require medical attention (report to your care team if they continue or are bothersome): Diarrhea Gas Headache Nausea Stomach pain Upset stomach This list may not describe all possible side effects. Call your doctor for medical advice about side effects. You may report side effects to FDA at 1-800-FDA-1088. Where should I keep my medication? Keep out of the reach of children and pets. Store between 20 and 25 degrees C (68 and 77 degrees F). Protect from light. Get rid of any unused medication after the expiration date. To get rid of medications that are no longer needed or have expired: Take the medication to a medication take-back program. Check with your pharmacy or law enforcement to find a location. If you cannot return the medication, check the label or package insert to see if the medication should be thrown out in the garbage or flushed down the toilet. If you are not sure, ask your care team. If it is safe to put it in the trash, take the medication out of the container. Mix the medication with cat litter, dirt, coffee grounds, or other unwanted substance. Seal the mixture in a bag or container. Put it in the trash. NOTE: This sheet is a summary. It may not cover all possible information. If you have questions about this medicine, talk to your doctor, pharmacist, or health care provider.  2023 Elsevier/Gold Standard (2021-06-27 00:00:00)  

## 2022-03-14 LAB — CBC WITH DIFFERENTIAL/PLATELET
Basophils Absolute: 0.1 10*3/uL (ref 0.0–0.2)
Basos: 1 %
EOS (ABSOLUTE): 0 10*3/uL (ref 0.0–0.4)
Eos: 0 %
Hematocrit: 49.1 % (ref 37.5–51.0)
Hemoglobin: 16.3 g/dL (ref 13.0–17.7)
Immature Grans (Abs): 0 10*3/uL (ref 0.0–0.1)
Immature Granulocytes: 0 %
Lymphocytes Absolute: 1.4 10*3/uL (ref 0.7–3.1)
Lymphs: 18 %
MCH: 30.6 pg (ref 26.6–33.0)
MCHC: 33.2 g/dL (ref 31.5–35.7)
MCV: 92 fL (ref 79–97)
Monocytes Absolute: 0.6 10*3/uL (ref 0.1–0.9)
Monocytes: 8 %
Neutrophils Absolute: 5.4 10*3/uL (ref 1.4–7.0)
Neutrophils: 73 %
Platelets: 136 10*3/uL — ABNORMAL LOW (ref 150–450)
RBC: 5.33 x10E6/uL (ref 4.14–5.80)
RDW: 12.7 % (ref 11.6–15.4)
WBC: 7.4 10*3/uL (ref 3.4–10.8)

## 2022-03-14 LAB — HEPATIC FUNCTION PANEL
ALT: 23 IU/L (ref 0–44)
AST: 22 IU/L (ref 0–40)
Albumin: 4.3 g/dL (ref 3.8–4.9)
Alkaline Phosphatase: 73 IU/L (ref 44–121)
Bilirubin Total: 0.4 mg/dL (ref 0.0–1.2)
Bilirubin, Direct: 0.12 mg/dL (ref 0.00–0.40)
Total Protein: 6.9 g/dL (ref 6.0–8.5)

## 2022-03-16 NOTE — Progress Notes (Signed)
Subjective:  ? ?Patient ID: Jeffrey Barton, male   DOB: 61 y.o.   MRN: [id number]  ? ?HPI ?61 year old male presents the office today for concerns of fungus.  He states this started many years ago.  Occasional gets discomfort in the nail bed and gets dry skin.  The nails are thick and discolored.  Does have a blister toed shoes at work.  No swelling redness or injury to the toenail sites.  No other concerns. ? ? ?Review of Systems  ?All other systems reviewed and are negative. ? ?No past medical history on file. ? ?No past surgical history on file. ? ? ?Current Outpatient Medications:  ?  Acetaminophen (TYLENOL 8 HOUR PO), Take by mouth., Disp: , Rfl:  ? ?No Known Allergies ? ? ? ? ?   ?Objective:  ?Physical Exam  ?General: AAO x3, NAD ? ?Dermatological: Nails are hypertrophic, dystrophic with yellow, brown discoloration.  No edema, erythema, signs of infection which nail sites.  No conditions.  Dry skin is present skin without any itching or skin fissures or open sores. ? ?Vascular: Dorsalis Pedis artery and Posterior Tibial artery pedal pulses are 2/4 bilateral with immedate capillary fill time. There is no pain with calf compression, swelling, warmth, erythema.  ? ?Neruologic: Grossly intact via light touch bilateral.  ? ?Musculoskeletal: No gross boney pedal deformities bilateral. No pain, crepitus, or limitation noted with foot and ankle range of motion bilateral. Muscular strength 5/5 in all groups tested bilateral. ? ?Gait: Unassisted, Nonantalgic.  ? ? ?   ?Assessment:  ? ?Onychomycosis, dry skin ? ?   ?Plan:  ?-Treatment options discussed including all alternatives, risks, and complications ?-Etiology of symptoms were discussed ?-Discussed different treatment options for the nail fungus.  Wants to proceed with oral Lamisil.  We will check a CBC and LFT prior to starting the medication.  Discussed side effects of medication should any occur once he starts this and let me know immediately.  Moisturizer for  the heels. ?-I also believe that wearing the steel toed shoes at work and chronic microtrauma is also contributing to the thickening. ? ?Vivi Barrack DPM ? ?   ? ?

## 2022-06-11 ENCOUNTER — Ambulatory Visit: Payer: 59 | Admitting: Podiatry

## 2022-06-11 DIAGNOSIS — B351 Tinea unguium: Secondary | ICD-10-CM | POA: Diagnosis not present

## 2022-06-11 DIAGNOSIS — M79671 Pain in right foot: Secondary | ICD-10-CM

## 2022-06-11 DIAGNOSIS — Z79899 Other long term (current) drug therapy: Secondary | ICD-10-CM

## 2022-06-11 MED ORDER — FLUCONAZOLE 150 MG PO TABS: 150.0000 mg | ORAL_TABLET | ORAL | 0 refills | Status: DC

## 2022-06-11 NOTE — Progress Notes (Signed)
Subjective:  Chief Complaint  Patient presents with   Nail Problem    Pt came in for Bilateral nail fungus, pt is doing better than last visit. Tx: topical nail gel. Pt denies any pain. Nail Trim today, pt would like to start Oral Meds     61 year old male presents the office with the above complaint.  Is [facility]  He would like to start oral Lamisil to help expedite the treatment.  No significant pain in the nails and no swelling redness or drainage.  No open lesions.  No other concerns.  Objective: AAO x3, NAD DP/PT pulses palpable bilaterally, CRT less than 3 seconds Skin to be hypertrophic, dystrophic with yellow, brown discoloration.  No hyperpigmentation.  No edema, erythema to the toenail sites.  No open lesions.  No other areas of discomfort. No pain with calf compression, swelling, warmth, erythema  Assessment: Onychomycosis  Plan: -All treatment options discussed with the patient including all alternatives, risks, complications.  -As a courtesy debrided the nails x10 without any complications or bleeding.  He can continue the [facility] cream [facility] for now.  However he does not do oral Lamisil.  We will recheck a CBC and LFT prior to starting this.  Again discussed side effects. -Patient encouraged to call the office with any questions, concerns, change in symptoms.   Vivi Barrack DPM

## 2022-06-11 NOTE — Patient Instructions (Signed)
GET BLOOD WORK DONE IN 4 WEEKS    Fluconazole Tablets What is this medication? FLUCONAZOLE (floo KON na zole) prevents and treats fungal or yeast infections. It belongs to a group of medications called antifungals. It will not prevent or treat colds, the flu, or infections caused by bacteria or viruses. This medicine may be used for other purposes; ask your health care provider or pharmacist if you have questions. COMMON BRAND NAME(S): Diflucan What should I tell my care team before I take this medication? They need to know if you have any of these conditions: Irregular heartbeat or rhythm Kidney disease Liver disease Low levels of potassium in the blood An unusual or allergic reaction to fluconazole, other azole antifungals, medications, foods, dyes, or preservatives Pregnant or trying to get pregnant Breast-feeding How should I use this medication? Take this medication by mouth. Follow the directions on the prescription label. Do not take your medication more often than directed. Talk to your care team about the use of this medication in children. Special care may be needed. This medication has been used in children as young as 54 months of age. Overdosage: If you think you have taken too much of this medicine contact a poison control center or emergency room at once. NOTE: This medicine is only for you. Do not share this medicine with others. What if I miss a dose? If you miss a dose, take it as soon as you can. If it is almost time for your next dose, take only that dose. Do not take double or extra doses. What may interact with this medication? Do not take this medication with any of the following: Adagrasib Flibanserin Lomitapide Lonafarnib Other medications that cause heart rhythm changes Triazolam This medication may also interact with the following: Abrocitinib Certain antibiotics, such as rifabutin or rifampin Certain antivirals for HIV or hepatitis Certain medications  for blood pressure, heart disease, irregular heartbeat Certain medications for cholesterol, such as atorvastatin, lovastatin, simvastatin Certain medications for depression, such as amitriptyline or nortriptyline Certain medications for diabetes, such as glipizide or glyburide Certain medications for seizures, such as carbamazepine or phenytoin Certain medications that treat or prevent blood clots, such as warfarin Certain opioid medications for pain, such as alfentanil, fentanyl, methadone Cyclophosphamide Cyclosporine Ibrutinib Lemborexant Midazolam NSAIDS, medications for pain and inflammation, such as ibuprofen or naproxen Olaparib Sirolimus Steroid medications, such as prednisone Tacrolimus Theophylline Tofacitinib Tolvaptan Vinblastine Vincristine Vitamin A Voriconazole This list may not describe all possible interactions. Give your health care provider a list of all the medicines, herbs, non-prescription drugs, or dietary supplements you use. Also tell them if you smoke, drink alcohol, or use illegal drugs. Some items may interact with your medicine. What should I watch for while using this medication? Visit your care team for regular checkups. If you are taking this medication for a long time you may need blood work. Tell your care team if your symptoms do not improve. Some fungal infections need many weeks or months of treatment to cure. Alcohol can increase possible damage to your liver. Avoid alcoholic drinks. If you have a vaginal infection, do not have sex until you have finished your treatment. You can wear a sanitary napkin. Do not use tampons. Wear freshly washed cotton, not synthetic, panties. What side effects may I notice from receiving this medication? Side effects that you should report to your care team as soon as possible: Allergic reactions--skin rash, itching, hives, swelling of the face, lips, tongue, or throat Heart rhythm  changes--fast or irregular  heartbeat, dizziness, feeling faint or lightheaded, chest pain, trouble breathing Liver injury--right upper belly pain, loss of appetite, nausea, light-colored stool, dark yellow or brown urine, yellowing skin or eyes, unusual weakness or fatigue Low adrenal gland function--nausea, vomiting, loss of appetite, unusual weakness or fatigue, dizziness Rash, fever, and swollen lymph nodes Redness, blistering, peeling, or loosening of the skin, including inside the mouth Seizures Side effects that usually do not require medical attention (report to your care team if they continue or are bothersome): Change in taste Diarrhea Dizziness Headache Nausea Stomach pain This list may not describe all possible side effects. Call your doctor for medical advice about side effects. You may report side effects to FDA at 1-800-FDA-1088. Where should I keep my medication? Keep out of the reach of children. Store at room temperature below 30 degrees C (86 degrees F). Throw away any medication after the expiration date. NOTE: This sheet is a summary. It may not cover all possible information. If you have questions about this medicine, talk to your doctor, pharmacist, or health care provider.  2023 Elsevier/Gold Standard (2021-12-13 00:00:00)

## 2022-06-28 ENCOUNTER — Other Ambulatory Visit: Payer: Self-pay | Admitting: Podiatry

## 2022-08-26 ENCOUNTER — Encounter: Payer: 59 | Admitting: Nurse Practitioner

## 2022-08-27 ENCOUNTER — Ambulatory Visit: Payer: 59 | Admitting: Podiatry

## 2022-09-09 ENCOUNTER — Ambulatory Visit: Payer: 59 | Admitting: Podiatry

## 2022-09-24 ENCOUNTER — Ambulatory Visit: Payer: 59 | Admitting: Podiatry

## 2022-09-24 DIAGNOSIS — B351 Tinea unguium: Secondary | ICD-10-CM

## 2022-09-24 DIAGNOSIS — Z79899 Other long term (current) drug therapy: Secondary | ICD-10-CM

## 2022-09-24 NOTE — Progress Notes (Signed)
Subjective: Chief Complaint  Patient presents with   Nail Problem    Bilateral nail fungus, Medication refill      61 year old male presents the office with the above complaint.  Is been using the compound cream through [facility].  He is again interested in oral medication.  No significant pain in the nails and no swelling redness or drainage.  No open lesions.  No other concerns.  Objective: AAO x3, NAD DP/PT pulses palpable bilaterally, CRT less than 3 seconds Skin to be hypertrophic, dystrophic with yellow, brown discoloration.  No hyperpigmentation.  No edema, erythema to the toenail sites.  No open lesions.  No other areas of discomfort. No pain with calf compression, swelling, warmth, erythema  Assessment: Onychomycosis  Plan: -All treatment options discussed with the patient including all alternatives, risks, complications.  -As a courtesy debrided the nails x10 without any complications or bleeding.  He [facility] for now.  However he does not do oral Lamisil.  We will recheck a CBC and LFT prior to starting this.  If we do Lamisil need to monitor platelet count.  He is very well aware of this. -Patient encouraged to call the office with any questions, concerns, change in symptoms.   Trula Slade DPM

## 2022-09-24 NOTE — Patient Instructions (Signed)
Terbinafine Tablets What is this medication? TERBINAFINE (TER bin a feen) treats fungal infections of the nails. It belongs to a group of medications called antifungals. It will not treat infections caused by bacteria or viruses. This medicine may be used for other purposes; ask your health care provider or pharmacist if you have questions. COMMON BRAND NAME(S): Lamisil, Terbinex What should I tell my care team before I take this medication? They need to know if you have any of these conditions: Liver disease An unusual or allergic reaction to terbinafine, other medications, foods, dyes, or preservatives Pregnant or trying to get pregnant Breast-feeding How should I use this medication? Take this medication by mouth with water. Take it as directed on the prescription label at the same time every day. You can take it with or without food. If it upsets your stomach, take it with food. Keep taking it unless your care team tells you to stop. A special MedGuide will be given to you by the pharmacist with each prescription and refill. Be sure to read this information carefully each time. Talk to your care team regarding the use of this medication in children. Special care may be needed. Overdosage: If you think you have taken too much of this medicine contact a poison control center or emergency room at once. NOTE: This medicine is only for you. Do not share this medicine with others. What if I miss a dose? If you miss a dose, take it as soon as you can unless it is more than 4 hours late. If it is more than 4 hours late, skip the missed dose. Take the next dose at the normal time. What may interact with this medication? Do not take this medication with any of the following: Pimozide Thioridazine This medication may also interact with the following: Beta blockers Caffeine Certain medications for mental health conditions Cimetidine Cyclosporine Medications for fungal infections like fluconazole  and ketoconazole Medications for irregular heartbeat like amiodarone, flecainide and propafenone Rifampin Warfarin This list may not describe all possible interactions. Give your health care provider a list of all the medicines, herbs, non-prescription drugs, or dietary supplements you use. Also tell them if you smoke, drink alcohol, or use illegal drugs. Some items may interact with your medicine. What should I watch for while using this medication? Visit your care team for regular checks on your progress. You may need blood work while you are taking this medication. It may be some time before you see the benefit from this medication. This medication may cause serious skin reactions. They can happen weeks to months after starting the medication. Contact your care team right away if you notice fevers or flu-like symptoms with a rash. The rash may be red or purple and then turn into blisters or peeling of the skin. Or, you might notice a red rash with swelling of the face, lips or lymph nodes in your neck or under your arms. This medication can make you more sensitive to the sun. Keep out of the sun, If you cannot avoid being in the sun, wear protective clothing and sunscreen. Do not use sun lamps or tanning beds/booths. What side effects may I notice from receiving this medication? Side effects that you should report to your care team as soon as possible: Allergic reactions--skin rash, itching, hives, swelling of the face, lips, tongue, or throat Change in sense of smell Change in taste Infection--fever, chills, cough, or sore throat Liver injury--right upper belly pain, loss of appetite, nausea,   light-colored stool, dark yellow or brown urine, yellowing skin or eyes, unusual weakness or fatigue Low red blood cell level--unusual weakness or fatigue, dizziness, headache, trouble breathing Lupus-like syndrome--joint pain, swelling, or stiffness, butterfly-shaped rash on the face, rashes that get worse  in the sun, fever, unusual weakness or fatigue Rash, fever, and swollen lymph nodes Redness, blistering, peeling, or loosening of the skin, including inside the mouth Unusual bruising or bleeding Worsening mood, feelings of depression Side effects that usually do not require medical attention (report to your care team if they continue or are bothersome): Diarrhea Gas Headache Nausea Stomach pain Upset stomach This list may not describe all possible side effects. Call your doctor for medical advice about side effects. You may report side effects to FDA at 1-800-FDA-1088. Where should I keep my medication? Keep out of the reach of children and pets. Store between 20 and 25 degrees C (68 and 77 degrees F). Protect from light. Get rid of any unused medication after the expiration date. To get rid of medications that are no longer needed or have expired: Take the medication to a medication take-back program. Check with your pharmacy or law enforcement to find a location. If you cannot return the medication, check the label or package insert to see if the medication should be thrown out in the garbage or flushed down the toilet. If you are not sure, ask your care team. If it is safe to put it in the trash, take the medication out of the container. Mix the medication with cat litter, dirt, coffee grounds, or other unwanted substance. Seal the mixture in a bag or container. Put it in the trash. NOTE: This sheet is a summary. It may not cover all possible information. If you have questions about this medicine, talk to your doctor, pharmacist, or health care provider.  2023 Elsevier/Gold Standard (2021-06-05 00:00:00)  

## 2022-09-25 ENCOUNTER — Other Ambulatory Visit: Payer: Self-pay | Admitting: Podiatry

## 2022-09-25 DIAGNOSIS — Z79899 Other long term (current) drug therapy: Secondary | ICD-10-CM

## 2022-09-25 LAB — HEPATIC FUNCTION PANEL
ALT: 20 IU/L (ref 0–44)
AST: 23 IU/L (ref 0–40)
Albumin: 4.3 g/dL (ref 3.8–4.9)
Alkaline Phosphatase: 72 IU/L (ref 44–121)
Bilirubin Total: 0.4 mg/dL (ref 0.0–1.2)
Bilirubin, Direct: 0.14 mg/dL (ref 0.00–0.40)
Total Protein: 6.8 g/dL (ref 6.0–8.5)

## 2022-09-25 LAB — CBC WITH DIFFERENTIAL/PLATELET
Basophils Absolute: 0.1 10*3/uL (ref 0.0–0.2)
Basos: 1 %
EOS (ABSOLUTE): 0 10*3/uL (ref 0.0–0.4)
Eos: 1 %
Hematocrit: 47.8 % (ref 37.5–51.0)
Hemoglobin: 16.2 g/dL (ref 13.0–17.7)
Immature Grans (Abs): 0 10*3/uL (ref 0.0–0.1)
Immature Granulocytes: 0 %
Lymphocytes Absolute: 1.6 10*3/uL (ref 0.7–3.1)
Lymphs: 24 %
MCH: 30.8 pg (ref 26.6–33.0)
MCHC: 33.9 g/dL (ref 31.5–35.7)
MCV: 91 fL (ref 79–97)
Monocytes Absolute: 0.7 10*3/uL (ref 0.1–0.9)
Monocytes: 10 %
Neutrophils Absolute: 4.3 10*3/uL (ref 1.4–7.0)
Neutrophils: 64 %
Platelets: 130 10*3/uL — ABNORMAL LOW (ref 150–450)
RBC: 5.26 x10E6/uL (ref 4.14–5.80)
RDW: 12.5 % (ref 11.6–15.4)
WBC: 6.6 10*3/uL (ref 3.4–10.8)

## 2022-09-25 MED ORDER — TERBINAFINE HCL 250 MG PO TABS: 250.0000 mg | ORAL_TABLET | Freq: Every day | ORAL | 0 refills | Status: DC

## 2022-10-01 DIAGNOSIS — B351 Tinea unguium: Secondary | ICD-10-CM | POA: Insufficient documentation

## 2022-10-07 ENCOUNTER — Other Ambulatory Visit: Payer: Self-pay

## 2022-10-07 ENCOUNTER — Telehealth: Payer: Self-pay | Admitting: *Deleted

## 2022-10-07 DIAGNOSIS — B351 Tinea unguium: Secondary | ICD-10-CM

## 2022-10-07 MED ORDER — TERBINAFINE HCL 250 MG PO TABS: 250.0000 mg | ORAL_TABLET | Freq: Every day | ORAL | 0 refills | Status: DC

## 2022-10-07 NOTE — Telephone Encounter (Signed)
Patient is calling for status of his Lamisil prescription, not able to get.Spoke with pharmacist and the reason the medication has not been released is because a prior authorization is needed. Spoke with pharmacist and none of the [facility] stores have in stock at the moment. He said that [facility] in [place] had it 1 day ago but patient is not wanting to go that far, will check around in [place] area and call back.

## 2022-10-07 NOTE — Telephone Encounter (Signed)
Spoke to the patient and he stated that he had spoke to Ammie earlier today and stated that he thinks that his message was mis communicated. [facility] [place], [place] doesn't have the [facility]il in stock but [facility] at [address] [facility]. [facility] and explained to the patient that the PA was done last Friday and we are still waiting on on his insurance to respond. Patient verbalized understanding of information given.

## 2022-10-16 ENCOUNTER — Telehealth: Payer: Self-pay | Admitting: Podiatry

## 2022-10-16 NOTE — Telephone Encounter (Signed)
Noted, thanks!

## 2022-10-16 NOTE — Telephone Encounter (Signed)
Completed prior authorization for Lamisil.

## 2022-12-23 ENCOUNTER — Ambulatory Visit (INDEPENDENT_AMBULATORY_CARE_PROVIDER_SITE_OTHER): Payer: 59 | Admitting: Nurse Practitioner

## 2022-12-23 ENCOUNTER — Encounter: Payer: Self-pay | Admitting: Nurse Practitioner

## 2022-12-23 VITALS — BP 128/80 | HR 102 | Temp 98.5°F | Ht 70.0 in | Wt 234.0 lb

## 2022-12-23 DIAGNOSIS — Z6835 Body mass index (BMI) 35.0-35.9, adult: Secondary | ICD-10-CM

## 2022-12-23 DIAGNOSIS — Z0000 Encounter for general adult medical examination without abnormal findings: Secondary | ICD-10-CM | POA: Diagnosis not present

## 2022-12-23 DIAGNOSIS — Z125 Encounter for screening for malignant neoplasm of prostate: Secondary | ICD-10-CM

## 2022-12-23 DIAGNOSIS — E7800 Pure hypercholesterolemia, unspecified: Secondary | ICD-10-CM

## 2022-12-23 DIAGNOSIS — Z23 Encounter for immunization: Secondary | ICD-10-CM

## 2022-12-23 DIAGNOSIS — E6609 Other obesity due to excess calories: Secondary | ICD-10-CM

## 2022-12-23 DIAGNOSIS — Z79899 Other long term (current) drug therapy: Secondary | ICD-10-CM

## 2022-12-23 DIAGNOSIS — Z114 Encounter for screening for human immunodeficiency virus [HIV]: Secondary | ICD-10-CM

## 2022-12-23 NOTE — Patient Instructions (Signed)
Health Maintenance, Male Adopting a healthy lifestyle and getting preventive care are important in promoting health and wellness. Ask your health care provider about: The right schedule for you to have regular tests and exams. Things you can do on your own to prevent diseases and keep yourself healthy. What should I know about diet, weight, and exercise? Eat a healthy diet  Eat a diet that includes plenty of vegetables, fruits, low-fat dairy products, and lean protein. Do not eat a lot of foods that are high in solid fats, added sugars, or sodium. Maintain a healthy weight Body mass index (BMI) is a measurement that can be used to identify possible weight problems. It estimates body fat based on height and weight. Your health care provider can help determine your BMI and help you achieve or maintain a healthy weight. Get regular exercise Get regular exercise. This is one of the most important things you can do for your health. Most adults should: Exercise for at least 150 minutes each week. The exercise should increase your heart rate and make you sweat (moderate-intensity exercise). Do strengthening exercises at least twice a week. This is in addition to the moderate-intensity exercise. Spend less time sitting. Even light physical activity can be beneficial. Watch cholesterol and blood lipids Have your blood tested for lipids and cholesterol at 61 years of age, then have this test every 5 years. You may need to have your cholesterol levels checked more often if: Your lipid or cholesterol levels are high. You are older than 61 years of age. You are at high risk for heart disease. What should I know about cancer screening? Many types of cancers can be detected early and may often be prevented. Depending on your health history and family history, you may need to have cancer screening at various ages. This may include screening for: Colorectal cancer. Prostate cancer. Skin cancer. Lung  cancer. What should I know about heart disease, diabetes, and high blood pressure? Blood pressure and heart disease High blood pressure causes heart disease and increases the risk of stroke. This is more likely to develop in people who have high blood pressure readings or are overweight. Talk with your health care provider about your target blood pressure readings. Have your blood pressure checked: Every 3-5 years if you are 18-39 years of age. Every year if you are 40 years old or older. If you are between the ages of 65 and 75 and are a current or former smoker, ask your health care provider if you should have a one-time screening for abdominal aortic aneurysm (AAA). Diabetes Have regular diabetes screenings. This checks your fasting blood sugar level. Have the screening done: Once every three years after age 45 if you are at a normal weight and have a low risk for diabetes. More often and at a younger age if you are overweight or have a high risk for diabetes. What should I know about preventing infection? Hepatitis B If you have a higher risk for hepatitis B, you should be screened for this virus. Talk with your health care provider to find out if you are at risk for hepatitis B infection. Hepatitis C Blood testing is recommended for: Everyone born from 1945 through 1965. Anyone with known risk factors for hepatitis C. Sexually transmitted infections (STIs) You should be screened each year for STIs, including gonorrhea and chlamydia, if: You are sexually active and are younger than 61 years of age. You are older than 61 years of age and your   health care provider tells you that you are at risk for this type of infection. Your sexual activity has changed since you were last screened, and you are at increased risk for chlamydia or gonorrhea. Ask your health care provider if you are at risk. Ask your health care provider about whether you are at high risk for HIV. Your health care provider  may recommend a prescription medicine to help prevent HIV infection. If you choose to take medicine to prevent HIV, you should first get tested for HIV. You should then be tested every 3 months for as long as you are taking the medicine. Follow these instructions at home: Alcohol use Do not drink alcohol if your health care provider tells you not to drink. If you drink alcohol: Limit how much you have to 0-2 drinks a day. Know how much alcohol is in your drink. In the [place] one drink equals one 12 oz bottle of beer (355 mL), one 5 oz glass of wine (148 mL), or one 1 oz glass of hard liquor (44 mL). Lifestyle Do not use any products that contain nicotine or tobacco. These products include cigarettes, chewing tobacco, and vaping devices, such as e-cigarettes. If you need help quitting, ask your health care provider. Do not use street drugs. Do not share needles. Ask your health care provider for help if you need support or information about quitting drugs. General instructions Schedule regular health, dental, and eye exams. Stay current with your vaccines. Tell your health care provider if: You often feel depressed. You have ever been abused or do not feel safe at home. Summary Adopting a healthy lifestyle and getting preventive care are important in promoting health and wellness. Follow your health care provider's instructions about healthy diet, exercising, and getting tested or screened for diseases. Follow your health care provider's instructions on monitoring your cholesterol and blood pressure. This information is not intended to replace advice given to you by your health care provider. Make sure you discuss any questions you have with your health care provider. Document Revised: 04/02/2021 Document Reviewed: 04/02/2021 Elsevier Patient Education  2023 Elsevier Inc.  

## 2022-12-23 NOTE — Progress Notes (Unsigned)
I,Tianna Badgett,acting as a Education administrator for [employer], FNP.,have documented all relevant documentation on the behalf of Minette Brine, FNP,as directed by  Minette Brine, FNP while in the presence of Minette Brine, [place].  Subjective:     Patient ID: Jeffrey Barton , male    DOB: 1961/04/12 , 62 y.o.   MRN: [id number]   Chief Complaint  Patient presents with   Annual Exam    HPI  Patient presents today for HM. He has been to the Podiatrist since his last visit. He is taking lamisil oral and he is using a fungus nail polish.  Wt Readings from Last 3 Encounters: 12/23/22 : 234 lb (106.1 kg) 02/27/22 : 245 lb (111.1 kg) 12/26/21 : 243 lb (110.2 kg)  He is trying to eat less sugar.       History reviewed. No pertinent past medical history.   Family History  Problem Relation Age of Onset   Healthy Mother      Current Outpatient Medications:    terbinafine (LAMISIL) 250 MG tablet, Take 1 tablet (250 mg total) by mouth daily., Disp: 90 tablet, Rfl: 0   No Known Allergies   Men's preventive visit. Patient Health Questionnaire (PHQ-2) is  [place] Office Visit from 12/23/2022 in [facility]  PHQ-2 Total Score 0     Patient is on a regular diet. Exercise with walking daily with work and regular 2-3 miles. Marital status: Married. Relevant history for alcohol use is:  Social History   Substance and Sexual Activity  Alcohol Use Yes   Comment: occasionally  . Relevant history for tobacco use is:  Social History   Tobacco Use  Smoking Status Never  Smokeless Tobacco Never  .   Review of Systems  Constitutional: Negative.   HENT: Negative.    Eyes: Negative.   Respiratory: Negative.    Cardiovascular: Negative.   Gastrointestinal: Negative.   Endocrine: Negative.   Genitourinary: Negative.   Musculoskeletal: Negative.   Skin: Negative.   Allergic/Immunologic: Negative.   Neurological: Negative.   Hematological: Negative.    Psychiatric/Behavioral: Negative.       Today's Vitals   12/23/22 1026  BP: 128/80  Pulse: (!) 102  Temp: 98.5 F (36.9 C)  TempSrc: Oral  Weight: 234 lb (106.1 kg)  Height: 5\' 10"  (1.778 m)   Body mass index is 33.58 kg/m.   Objective:  Physical Exam Vitals reviewed.  Constitutional:      Appearance: Normal appearance. He is obese.  HENT:     Head: Normocephalic and atraumatic.     Right Ear: Tympanic membrane, ear canal and external ear normal. There is no impacted cerumen.     Left Ear: Tympanic membrane, ear canal and external ear normal. There is no impacted cerumen.  Cardiovascular:     Rate and Rhythm: Normal rate and regular rhythm.     Pulses: Normal pulses.     Heart sounds: Normal heart sounds. No murmur heard. Pulmonary:     Effort: Pulmonary effort is normal. No respiratory distress.     Breath sounds: Normal breath sounds. No wheezing.  Abdominal:     General: Abdomen is flat. Bowel sounds are normal. There is no distension.     Palpations: Abdomen is soft.  Genitourinary:    Prostate: Normal.     Rectum: Guaiac result negative.  Musculoskeletal:        General: Normal range of motion.     Cervical back: Normal range of  motion and neck supple.  Skin:    General: Skin is warm and dry.     Capillary Refill: Capillary refill takes less than 2 seconds.  Neurological:     General: No focal deficit present.     Mental Status: He is alert and oriented to person, place, and time.     Cranial Nerves: No cranial nerve deficit.     Motor: No weakness.  Psychiatric:        Mood and Affect: Mood normal.        Behavior: Behavior normal.        Thought Content: Thought content normal.        Judgment: Judgment normal.         Assessment And Plan:    1. Encounter for annual physical exam - VITAMIN D 25 Hydroxy (Vit-D Deficiency, Fractures)  2. Elevated cholesterol Comments: No current medications, encouraged to eat a low fat diet. - Lipid panel  3.  Screening for HIV without presence of risk factors - HIV Antibody (routine testing w rflx)  4. Need for influenza vaccination Influenza vaccine administered Encouraged to take Tylenol as needed for fever or muscle aches. - Flu Vaccine QUAD 6+ mos PF IM (Fluarix Quad PF)  5. Encounter for prostate cancer screening - PSA  6. Class 2 obesity due to excess calories without serious comorbidity with body mass index (BMI) of 35.0 to 35.9 in adult She is encouraged to strive for BMI less than 30 to decrease cardiac risk. Advised to aim for at least 150 minutes of exercise per week. - Hemoglobin A1c - CMP14+EGFR  7. Other long term (current) drug therapy - CBC    Patient was given opportunity to ask questions. Patient verbalized understanding of the plan and was able to repeat key elements of the plan. All questions were answered to their satisfaction.   Minette Brine, FNP   I, Minette Brine, FNP, have reviewed all documentation for this visit. The documentation on 12/23/22 for the exam, diagnosis, procedures, and orders are all accurate and complete.   THE PATIENT IS ENCOURAGED TO PRACTICE SOCIAL DISTANCING DUE TO THE COVID-19 PANDEMIC.

## 2022-12-24 LAB — CBC
Hematocrit: 45.3 % (ref 37.5–51.0)
Hemoglobin: 15.2 g/dL (ref 13.0–17.7)
MCH: 30.9 pg (ref 26.6–33.0)
MCHC: 33.6 g/dL (ref 31.5–35.7)
MCV: 92 fL (ref 79–97)
Platelets: 114 10*3/uL — ABNORMAL LOW (ref 150–450)
RBC: 4.92 x10E6/uL (ref 4.14–5.80)
RDW: 12.5 % (ref 11.6–15.4)
WBC: 5.2 10*3/uL (ref 3.4–10.8)

## 2022-12-24 LAB — VITAMIN D 25 HYDROXY (VIT D DEFICIENCY, FRACTURES): Vit D, 25-Hydroxy: 39.1 ng/mL (ref 30.0–100.0)

## 2022-12-24 LAB — CMP14+EGFR
ALT: 21 IU/L (ref 0–44)
AST: 22 IU/L (ref 0–40)
Albumin/Globulin Ratio: 1.5 (ref 1.2–2.2)
Albumin: 3.8 g/dL (ref 3.8–4.9)
Alkaline Phosphatase: 70 IU/L (ref 44–121)
BUN/Creatinine Ratio: 9 — ABNORMAL LOW (ref 10–24)
BUN: 10 mg/dL (ref 8–27)
Bilirubin Total: 0.3 mg/dL (ref 0.0–1.2)
CO2: 24 mmol/L (ref 20–29)
Calcium: 8.6 mg/dL (ref 8.6–10.2)
Chloride: 109 mmol/L — ABNORMAL HIGH (ref 96–106)
Creatinine, Ser: 1.16 mg/dL (ref 0.76–1.27)
Globulin, Total: 2.5 g/dL (ref 1.5–4.5)
Glucose: 92 mg/dL (ref 70–99)
Potassium: 3.9 mmol/L (ref 3.5–5.2)
Sodium: 148 mmol/L — ABNORMAL HIGH (ref 134–144)
Total Protein: 6.3 g/dL (ref 6.0–8.5)
eGFR: 72 mL/min/{1.73_m2} (ref >59)

## 2022-12-24 LAB — LIPID PANEL
Chol/HDL Ratio: 3.1 ratio (ref 0.0–5.0)
Cholesterol, Total: 179 mg/dL (ref 100–199)
HDL: 58 mg/dL (ref >39)
LDL Chol Calc (NIH): 100 mg/dL — ABNORMAL HIGH (ref 0–99)
Triglycerides: 116 mg/dL (ref 0–149)
VLDL Cholesterol Cal: 21 mg/dL (ref 5–40)

## 2022-12-24 LAB — PSA: Prostate Specific Ag, Serum: 2.3 ng/mL (ref 0.0–4.0)

## 2022-12-24 LAB — HEMOGLOBIN A1C
Est. average glucose Bld gHb Est-mCnc: 105 mg/dL
Hgb A1c MFr Bld: 5.3 % (ref 4.8–5.6)

## 2022-12-24 LAB — HIV ANTIBODY (ROUTINE TESTING W REFLEX): HIV Screen 4th Generation wRfx: NONREACTIVE

## 2023-01-06 NOTE — Progress Notes (Signed)
Okay continue to stay well hydrated with water and make sure he has a f/u in 3 months, if levels remain low will refer to hematologist for further evaluation

## 2023-01-09 DIAGNOSIS — Z860100 Personal history of colon polyps, unspecified: Secondary | ICD-10-CM | POA: Insufficient documentation

## 2023-01-09 DIAGNOSIS — K760 Fatty (change of) liver, not elsewhere classified: Secondary | ICD-10-CM | POA: Insufficient documentation

## 2023-01-10 ENCOUNTER — Ambulatory Visit (INDEPENDENT_AMBULATORY_CARE_PROVIDER_SITE_OTHER): Payer: 59

## 2023-01-10 VITALS — BP 120/70 | HR 74 | Temp 98.5°F | Ht 70.0 in | Wt 234.0 lb

## 2023-01-10 DIAGNOSIS — Z23 Encounter for immunization: Secondary | ICD-10-CM | POA: Diagnosis not present

## 2023-01-10 NOTE — Patient Instructions (Signed)

## 2023-01-10 NOTE — Progress Notes (Signed)
Patient presents today for shingles injection.

## 2023-03-11 ENCOUNTER — Ambulatory Visit (INDEPENDENT_AMBULATORY_CARE_PROVIDER_SITE_OTHER): Payer: 59 | Admitting: Nurse Practitioner

## 2023-03-11 ENCOUNTER — Encounter: Payer: Self-pay | Admitting: Nurse Practitioner

## 2023-03-11 VITALS — BP 132/84 | HR 72 | Temp 98.7°F | Ht 70.0 in | Wt 239.0 lb

## 2023-03-11 DIAGNOSIS — E7800 Pure hypercholesterolemia, unspecified: Secondary | ICD-10-CM

## 2023-03-11 DIAGNOSIS — D691 Qualitative platelet defects: Secondary | ICD-10-CM | POA: Diagnosis not present

## 2023-03-11 DIAGNOSIS — Z23 Encounter for immunization: Secondary | ICD-10-CM | POA: Diagnosis not present

## 2023-03-11 NOTE — Progress Notes (Signed)
  I,Twala Collings H Ponciano Shealy,acting as a Neurosurgeon for Arnette Felts, FNP.,have documented all relevant documentation on the behalf of Arnette Felts, FNP,as directed by  Arnette Felts, FNP while in the presence of Arnette Felts, FNP.    Subjective:     Patient ID: Jeffrey Barton , male    DOB: 1961-08-04 , 62 y.o.   MRN: [id number]   Chief Complaint  Patient presents with   Medical Management of Chronic Issues    HPI  Patient presents today for follow up visit.       No past medical history on file.   Family History  Problem Relation Age of Onset   Healthy Mother      Current Outpatient Medications:    terbinafine (LAMISIL) 250 MG tablet, Take 1 tablet (250 mg total) by mouth daily., Disp: 90 tablet, Rfl: 0   No Known Allergies   Review of Systems  All other systems reviewed and are negative.    There were no vitals filed for this visit. There is no height or weight on file to calculate BMI.   Objective:  Physical Exam      Assessment And Plan:     There are no diagnoses linked to this encounter.    Patient was given opportunity to ask questions. Patient verbalized understanding of the plan and was able to repeat key elements of the plan. All questions were answered to their satisfaction.  Regis Bill, CMA   I, Regis Bill, CMA, have reviewed all documentation for this visit. The documentation on 03/11/23 for the exam, diagnosis, procedures, and orders are all accurate and complete.   IF YOU HAVE BEEN REFERRED TO A SPECIALIST, IT MAY TAKE 1-2 WEEKS TO SCHEDULE/PROCESS THE REFERRAL. IF YOU HAVE NOT HEARD FROM US/SPECIALIST IN TWO WEEKS, PLEASE GIVE [place] A CALL AT [phone] X [phone].   THE PATIENT IS ENCOURAGED TO PRACTICE SOCIAL DISTANCING DUE TO THE COVID-19 PANDEMIC.

## 2023-03-11 NOTE — Progress Notes (Signed)
    Subjective:     Patient ID: Jeffrey Barton , male    DOB: May 15, 1961 , 62 y.o.   MRN: [id number]   Chief Complaint  Patient presents with   Medical Management of Chronic Issues    HPI  Pt presents today for 3 month followup. 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 He is here for his second shingles shot. He is not sure what he is here to follow up for.  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 He drinks a lot of protein shakes. He admits to drinking plenty of water and walks 15,000/day at work. He reports to only coming to the office today for a shingles shot. Denies chest pain, dyspnea, and swelling in lower extremities.      History reviewed. No pertinent past medical history.   Family History  Problem Relation Age of Onset   Healthy Mother      Current Outpatient Medications:    terbinafine (LAMISIL) 250 MG tablet, Take 1 tablet (250 mg total) by mouth daily., Disp: 90 tablet, Rfl: 0   No Known Allergies   Review of Systems  Constitutional: Negative.   Respiratory: Negative.    Cardiovascular: Negative.   Neurological: Negative.   Psychiatric/Behavioral: Negative.       Today's Vitals   03/11/23 0949  BP: 132/84  Pulse: 72  Temp: 98.7 F (37.1 C)  TempSrc: Oral  SpO2: 97%  Weight: 239 lb (108.4 kg)  Height: 5\' 10"  (1.778 m)   Body mass index is 34.29 kg/m.   Objective:  Physical Exam Constitutional:      Appearance: Normal appearance.  HENT:     Head: Normocephalic.  Cardiovascular:     Rate and Rhythm: Normal rate and regular rhythm.     Pulses: Normal pulses.     Heart sounds: Normal heart sounds, S1 normal and S2 normal. No murmur heard. Pulmonary:     Effort: Pulmonary effort is normal. No respiratory distress.     Breath sounds: Normal breath sounds. No wheezing.  Skin:    General: Skin is warm and moist.  Neurological:     Mental Status: He is alert.          Assessment And Plan:     1. Elevated cholesterol Comments: Cholesterol levels are stable, continue focusing on low fat diet  2. Abnormal platelets (HCC) Comments: No abnormal bruising if remains low at next lab draw will consider referral to hematology  3. Need for shingles vaccine Shingrix #2 given in office. - Zoster Recombinant (Shingrix )     Patient was given opportunity to ask questions. Patient verbalized understanding of the plan and was able to repeat key elements of the plan. All questions were answered to their satisfaction.  Arnette Felts, FNP   I, Arnette Felts, FNP, have reviewed all documentation for this visit. The documentation on 03/11/23 for the exam, diagnosis, procedures, and orders are all accurate and complete.   IF YOU HAVE BEEN REFERRED TO A SPECIALIST, IT MAY TAKE 1-2 WEEKS TO SCHEDULE/PROCESS THE REFERRAL. IF YOU HAVE NOT HEARD FROM US/SPECIALIST IN TWO WEEKS, PLEASE GIVE [place] A CALL AT [phone] X [phone].   THE PATIENT IS ENCOURAGED TO PRACTICE SOCIAL DISTANCING DUE TO THE COVID-19 PANDEMIC.

## 2023-03-21 ENCOUNTER — Encounter: Payer: Self-pay | Admitting: Nurse Practitioner

## 2023-04-15 ENCOUNTER — Ambulatory Visit: Payer: Self-pay

## 2023-04-28 ENCOUNTER — Ambulatory Visit: Payer: 59 | Admitting: Nurse Practitioner

## 2023-06-10 ENCOUNTER — Ambulatory Visit: Payer: 59 | Admitting: Nurse Practitioner

## 2023-07-01 ENCOUNTER — Encounter: Payer: Self-pay | Admitting: Nurse Practitioner

## 2023-07-01 ENCOUNTER — Ambulatory Visit (INDEPENDENT_AMBULATORY_CARE_PROVIDER_SITE_OTHER): Payer: 59 | Admitting: Nurse Practitioner

## 2023-07-01 VITALS — BP 120/80 | HR 69 | Temp 98.4°F | Ht 70.0 in | Wt 244.0 lb

## 2023-07-01 DIAGNOSIS — E7800 Pure hypercholesterolemia, unspecified: Secondary | ICD-10-CM | POA: Diagnosis not present

## 2023-07-01 DIAGNOSIS — Z6835 Body mass index (BMI) 35.0-35.9, adult: Secondary | ICD-10-CM | POA: Diagnosis not present

## 2023-07-01 DIAGNOSIS — E6609 Other obesity due to excess calories: Secondary | ICD-10-CM

## 2023-07-01 NOTE — Assessment & Plan Note (Signed)
Cholesterol levels have improved except for LDL is slightly elevated, will repeat lipid panel.

## 2023-07-01 NOTE — Assessment & Plan Note (Signed)
He is encouraged to strive for BMI less than 30 to decrease cardiac risk. Advised to aim for at least 150 minutes of exercise per week.  
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
## 2023-07-10 ENCOUNTER — Telehealth: Payer: Self-pay | Admitting: Nurse Practitioner

## 2023-08-27 NOTE — Telephone Encounter (Signed)
Error

## 2023-09-08 ENCOUNTER — Other Ambulatory Visit: Payer: Self-pay

## 2023-09-08 DIAGNOSIS — Z1211 Encounter for screening for malignant neoplasm of colon: Secondary | ICD-10-CM

## 2023-09-08 DIAGNOSIS — Z125 Encounter for screening for malignant neoplasm of prostate: Secondary | ICD-10-CM

## 2023-09-12 ENCOUNTER — Other Ambulatory Visit: Payer: Self-pay | Admitting: *Deleted

## 2023-09-12 DIAGNOSIS — Z1211 Encounter for screening for malignant neoplasm of colon: Secondary | ICD-10-CM

## 2023-09-12 DIAGNOSIS — Z125 Encounter for screening for malignant neoplasm of prostate: Secondary | ICD-10-CM

## 2023-09-12 NOTE — Progress Notes (Signed)
Patient: Jeffrey Barton           Date of Birth: 1960/12/25           MRN: [id number] Visit Date: 09/12/2023 PCP: Arnette Felts, FNP  Prostate Cancer Screening Date of last physical exam:  (Early 2024) Date of last rectal exam:  (N/A) Have you ever had any of the following?: None Have you ever had or been told you have an allergy to latex products?: No Are you currently taking any natural prostate preparations?: No Are you currently experiencing any urinary symptoms?: No  Prostate Exam Exam not completed. PSA blood test only completed.   Patient's History Patient Active Problem List   Diagnosis Date Noted   Elevated cholesterol 07/01/2023   Class 2 obesity due to excess calories without serious comorbidity with body mass index (BMI) of 35.0 to 35.9 in adult 07/01/2023   Fatty liver 01/09/2023   History of colonic polyps 01/09/2023   Onychomycosis 10/01/2022   Thrombocytopenia (HCC) 03/06/2020   Elevated TSH 03/06/2020   Past Medical History:  Diagnosis Date   Class 2 obesity due to excess calories without serious comorbidity with body mass index (BMI) of 36.0 to 36.9 in adult 03/06/2020    Family History  Problem Relation Age of Onset   Healthy Mother     Social History   Occupational History   Not on file  Tobacco Use   Smoking status: Never   Smokeless tobacco: Never  Substance and Sexual Activity   Alcohol use: Yes    Comment: occasionally   Drug use: No   Sexual activity: Yes

## 2023-09-12 NOTE — Progress Notes (Signed)
See flowsheet for health history. FIT Test given to patient to complete at home.

## 2023-09-13 LAB — PSA: Prostate Specific Ag, Serum: 3.1 ng/mL (ref 0.0–4.0)

## 2023-09-18 ENCOUNTER — Telehealth: Payer: Self-pay

## 2023-09-18 NOTE — Telephone Encounter (Signed)
Patient informed PSA results 3.1., per Dr. Vevelyn Royals recommendations, males of age 61 years and older, PSA level should be less than 3.0. Patient denies any frequent urination, pain, blood in urine,but states he and his wife do have a very active sex life, doing well otherwise. Patient to discuss with PCP at upcoming CPE.

## 2023-09-19 LAB — FECAL OCCULT BLOOD, IMMUNOCHEMICAL: Fecal Occult Bld: NEGATIVE

## 2023-12-25 ENCOUNTER — Encounter: Payer: Self-pay | Admitting: Nurse Practitioner

## 2023-12-25 ENCOUNTER — Ambulatory Visit (INDEPENDENT_AMBULATORY_CARE_PROVIDER_SITE_OTHER): Payer: 59 | Admitting: Nurse Practitioner

## 2023-12-25 VITALS — BP 120/68 | HR 78 | Temp 98.7°F | Ht 70.0 in | Wt 244.2 lb

## 2023-12-25 DIAGNOSIS — H6123 Impacted cerumen, bilateral: Secondary | ICD-10-CM | POA: Diagnosis not present

## 2023-12-25 DIAGNOSIS — E7800 Pure hypercholesterolemia, unspecified: Secondary | ICD-10-CM

## 2023-12-25 DIAGNOSIS — Z6835 Body mass index (BMI) 35.0-35.9, adult: Secondary | ICD-10-CM

## 2023-12-25 DIAGNOSIS — Z23 Encounter for immunization: Secondary | ICD-10-CM

## 2023-12-25 DIAGNOSIS — G4733 Obstructive sleep apnea (adult) (pediatric): Secondary | ICD-10-CM | POA: Diagnosis not present

## 2023-12-25 DIAGNOSIS — N5089 Other specified disorders of the male genital organs: Secondary | ICD-10-CM

## 2023-12-25 DIAGNOSIS — Z79899 Other long term (current) drug therapy: Secondary | ICD-10-CM

## 2023-12-25 DIAGNOSIS — Z0000 Encounter for general adult medical examination without abnormal findings: Secondary | ICD-10-CM | POA: Diagnosis not present

## 2023-12-25 DIAGNOSIS — Z125 Encounter for screening for malignant neoplasm of prostate: Secondary | ICD-10-CM

## 2023-12-25 DIAGNOSIS — Z9989 Dependence on other enabling machines and devices: Secondary | ICD-10-CM

## 2023-12-25 DIAGNOSIS — Z862 Personal history of diseases of the blood and blood-forming organs and certain disorders involving the immune mechanism: Secondary | ICD-10-CM

## 2023-12-25 MED ORDER — ZEPBOUND 2.5 MG/0.5ML ~~LOC~~ SOAJ
2.5000 mg | SUBCUTANEOUS | 0 refills | Status: DC
Start: 2023-12-25 — End: 2024-01-20

## 2023-12-25 NOTE — Assessment & Plan Note (Signed)
Wax is removed by with lighted curette, removed firm cerumen from right ear and small amount from left ear.  Instructions for home care to prevent wax buildup are given.

## 2023-12-25 NOTE — Progress Notes (Signed)
Madelaine Bhat, CMA,acting as a Neurosurgeon for Arnette Felts, FNP.,have documented all relevant documentation on the behalf of Arnette Felts, FNP,as directed by  Arnette Felts, FNP while in the presence of Arnette Felts, FNP.  Subjective:   Patient ID: Jeffrey Barton , male    DOB: [birth year] , 63 y.o.   MRN: [id number]  Chief Complaint  Patient presents with   Annual Exam    HPI  Patient presents today for HM, Patient reports compliance with medication. Patient denies any chest pain, SOB, or headaches. Patient has no concerns today. Patient would like to start a weight loss medication. Patient reports he feels a lump in his testicles he would like checked, has been there for several years. He feels like it is getting larger, no pain. Has seen urology in the past. Occasionally will have changes in urine flow - sometimes slow and sometimes has a heavy stream  He wears a CPAP most nights has had for about 3-4 years. He has not taken any medications for weight loss.      Past Medical History:  Diagnosis Date   Class 2 obesity due to excess calories without serious comorbidity with body mass index (BMI) of 36.0 to 36.9 in adult 03/06/2020   Oxygen deficiency 08/2020   Sleep apnea 08/2020     Family History  Problem Relation Age of Onset   Healthy Mother      Current Outpatient Medications:    tirzepatide (ZEPBOUND) 2.5 MG/0.5ML Pen, Inject 2.5 mg into the skin once a week., Disp: 2 mL, Rfl: 0   No Known Allergies   Men's preventive visit. Patient Health Questionnaire (PHQ-2) is  Flowsheet Row Office Visit from 12/25/2023 in [facility]  PHQ-2 Total Score 0     Patient is on a Regular diet; breakfast - smoothie (blueberries, pineapples, coffee creamer), lunch - rice, chicken or pork, dinner - varies. Chips and oreos, also likes the salty foods. He thinks after football season.  Will eat wings when going out. On Sunday will eat ox tails, jerk and curry.  Desserts. Exercise - 18,000-20,000 steps at work.  Marital status: Married. Relevant history for alcohol use is:  Social History   Substance and Sexual Activity  Alcohol Use Yes   Alcohol/week: 6.0 standard drinks of alcohol   Types: 2 Glasses of wine, 4 Cans of beer per week   Comment: occasionally   Relevant history for tobacco use is:  Social History   Tobacco Use  Smoking Status Never  Smokeless Tobacco Never  .   Review of Systems  Constitutional: Negative.   HENT: Negative.    Eyes: Negative.   Respiratory: Negative.    Cardiovascular: Negative.   Gastrointestinal: Negative.   Endocrine: Negative.   Genitourinary: Negative.   Musculoskeletal: Negative.   Skin: Negative.   Allergic/Immunologic: Negative.   Neurological: Negative.   Hematological: Negative.   Psychiatric/Behavioral: Negative.       Today's Vitals   12/25/23 1000  BP: 120/68  Pulse: 78  Temp: 98.7 F (37.1 C)  TempSrc: Oral  Weight: 244 lb 3.2 oz (110.8 kg)  Height: 5\' 10"  (1.778 m)  PainSc: 0-No pain   Body mass index is 35.04 kg/m.  Wt Readings from Last 3 Encounters:  12/25/23 244 lb 3.2 oz (110.8 kg)  07/01/23 244 lb (110.7 kg)  03/11/23 239 lb (108.4 kg)    Objective:  Physical Exam Vitals reviewed.  Constitutional:      General: He  is not in acute distress.    Appearance: Normal appearance. He is obese.  HENT:     Head: Normocephalic and atraumatic.     Right Ear: Tympanic membrane, ear canal and external ear normal. There is no impacted cerumen.     Left Ear: Tympanic membrane, ear canal and external ear normal. There is no impacted cerumen.     Nose: Nose normal.     Mouth/Throat:     Mouth: Mucous membranes are moist.  Cardiovascular:     Rate and Rhythm: Normal rate and regular rhythm.     Pulses: Normal pulses.     Heart sounds: Normal heart sounds. No murmur heard. Pulmonary:     Effort: Pulmonary effort is normal. No respiratory distress.     Breath sounds:  Normal breath sounds. No wheezing.  Abdominal:     General: Abdomen is flat. Bowel sounds are normal. There is no distension.     Palpations: Abdomen is soft.  Genitourinary:    Penis: Normal.      Testes:        Right: Mass, tenderness or swelling not present.     Prostate: Normal.     Rectum: Guaiac result negative.     Comments: Mass to scrotal area on right side, able to palpate both testicles Musculoskeletal:        General: Normal range of motion.     Cervical back: Normal range of motion and neck supple.  Skin:    General: Skin is warm and dry.     Capillary Refill: Capillary refill takes less than 2 seconds.  Neurological:     General: No focal deficit present.     Mental Status: He is alert and oriented to person, place, and time.     Cranial Nerves: No cranial nerve deficit.     Motor: No weakness.  Psychiatric:        Mood and Affect: Mood normal.        Behavior: Behavior normal.        Thought Content: Thought content normal.        Judgment: Judgment normal.         Assessment And Plan:    Encounter for annual health examination  Need for influenza vaccination Assessment & Plan: Influenza vaccine administered Encouraged to take Tylenol as needed for fever or muscle aches.   Orders: -     Flu vaccine trivalent PF, 6mos and older(Flulaval,Afluria,Fluarix,Fluzone)  COVID-19 vaccine administered Assessment & Plan: Covid 19 vaccine given in office observed for 15 minutes without any adverse reaction   Orders: Best boy Vaccine 33yrs & older  Obesity, morbid (HCC) Assessment & Plan: he is encouraged to strive for BMI less than 30 to decrease cardiac risk.  Advised to aim for at least 150 minutes of exercise per week. Goal to exercise 150 minutes per week with at least 2 days of strength training Encouraged to park further when at the store, take stairs instead of elevators and to walk in place during commercials. Increase water  intake to at least one gallon of water daily.     Orders: -     Zepbound; Inject 2.5 mg into the skin once a week.  Dispense: 2 mL; Refill: 0  BMI 35.0-35.9,adult  Encounter for prostate cancer screening -     PSA Total (Reflex To Free)  Elevated cholesterol Assessment & Plan: Cholesterol levels have improved except for LDL is slightly elevated, will  repeat lipid panel.   Orders: -     CMP14+EGFR -     Lipid panel  History of thrombocytopenia -     CBC with Differential/Platelet  OSA on CPAP Assessment & Plan: Wears his CPAP most nights. Will try to get him approved for Zepbound. Discussed side effects to include constipation and nausea. Increase water and fiber intake, may also take a stool softner.   Orders: -     Zepbound; Inject 2.5 mg into the skin once a week.  Dispense: 2 mL; Refill: 0  Scrotal mass Assessment & Plan: Has a palpable mass to scrotal area. Reports has been there for some time but feels may have increased in size  Orders: -     Ambulatory referral to Urology -     US SCROTUM W/DOPPLER; Future  Bilateral impacted cerumen Assessment & Plan: Wax is removed by with lighted curette, removed firm cerumen from right ear and small amount from left ear.  Instructions for home care to prevent wax buildup are given.    Other orders -     Thyroid Panel With TSH -     Specimen status report     Return for 1 year physical, 6 month chol check; , 8-10 week weight check if approved for Zepbound. Patient was given opportunity to ask questions. Patient verbalized understanding of the plan and was able to repeat key elements of the plan. All questions were answered to their satisfaction.   Arnette Felts, FNP  I, Arnette Felts, FNP, have reviewed all documentation for this visit. The documentation on 12/25/23 for the exam, diagnosis, procedures, and orders are all accurate and complete.

## 2023-12-25 NOTE — Patient Instructions (Addendum)
Health Maintenance  Topic Date Due   COVID-19 Vaccine (6 - 2024-25 season) 02/19/2024   DTaP/Tdap/Td vaccine (2 - Td or Tdap) 09/27/2025   Colon Cancer Screening  01/06/2030   Flu Shot  Completed   Hepatitis C Screening  Completed   HIV Screening  Completed   Zoster (Shingles) Vaccine  Completed   HPV Vaccine  Aged Out   Goal to exercise 150 minutes per week with at least 2 days of strength training Encouraged to park further when at the store, take stairs instead of elevators and to walk in place during commercials. Increase water intake to at least one gallon of water daily. we have started Zepbound pending insurance approval.  Do not overeat as you will be nauseated, make sure you are having regular bowel movements if not you can take a stool softener. Let me know if you have any difficulty swallowing and abdominal pain.

## 2023-12-27 LAB — THYROID PANEL WITH TSH
Free Thyroxine Index: 2.3 (ref 1.2–4.9)
T3 Uptake Ratio: 28 % (ref 24–39)
T4, Total: 8.3 ug/dL (ref 4.5–12.0)
TSH: 1.56 u[IU]/mL (ref 0.450–4.500)

## 2023-12-27 LAB — SPECIMEN STATUS REPORT

## 2023-12-28 ENCOUNTER — Encounter: Payer: Self-pay | Admitting: Nurse Practitioner

## 2023-12-28 NOTE — Assessment & Plan Note (Signed)
 Influenza vaccine administered Encouraged to take Tylenol as needed for fever or muscle aches.

## 2023-12-28 NOTE — Assessment & Plan Note (Signed)
Cholesterol levels have improved except for LDL is slightly elevated, will repeat lipid panel.

## 2023-12-28 NOTE — Assessment & Plan Note (Signed)
 Covid 19 vaccine given in office observed for 15 minutes without any adverse reaction

## 2023-12-28 NOTE — Assessment & Plan Note (Signed)
 Behavior modifications discussed and diet history reviewed.   Pt will continue to exercise regularly and modify diet with low GI, plant based foods and decrease intake of processed foods.  Recommend intake of daily multivitamin, Vitamin D, and calcium.  Recommend colonoscopy for preventive screenings, as well as recommend immunizations that include influenza, TDAP, and Shingles

## 2023-12-28 NOTE — Assessment & Plan Note (Signed)
he is encouraged to strive for BMI less than 30 to decrease cardiac risk.  Advised to aim for at least 150 minutes of exercise per week. Goal to exercise 150 minutes per week with at least 2 days of strength training Encouraged to park further when at the store, take stairs instead of elevators and to walk in place during commercials. Increase water intake to at least one gallon of water daily.

## 2023-12-28 NOTE — Assessment & Plan Note (Signed)
Has a palpable mass to scrotal area. Reports has been there for some time but feels may have increased in size

## 2023-12-28 NOTE — Assessment & Plan Note (Signed)
Wears his CPAP most nights. Will try to get him approved for Zepbound. Discussed side effects to include constipation and nausea. Increase water and fiber intake, may also take a stool softner.

## 2023-12-31 ENCOUNTER — Ambulatory Visit
Admission: RE | Admit: 2023-12-31 | Discharge: 2023-12-31 | Discharge status: Home / Self Care | Payer: 59 | Source: Ambulatory Visit | Attending: Nurse Practitioner | Admitting: Nurse Practitioner

## 2023-12-31 DIAGNOSIS — N5089 Other specified disorders of the male genital organs: Secondary | ICD-10-CM

## 2024-01-01 ENCOUNTER — Other Ambulatory Visit: Payer: Self-pay | Admitting: Nurse Practitioner

## 2024-01-01 ENCOUNTER — Encounter: Payer: Self-pay | Admitting: Nurse Practitioner

## 2024-01-01 DIAGNOSIS — N5089 Other specified disorders of the male genital organs: Secondary | ICD-10-CM

## 2024-01-01 DIAGNOSIS — I861 Scrotal varices: Secondary | ICD-10-CM

## 2024-01-01 DIAGNOSIS — N432 Other hydrocele: Secondary | ICD-10-CM

## 2024-01-01 LAB — CBC WITH DIFFERENTIAL/PLATELET
Basophils Absolute: 0 10*3/uL (ref 0.0–0.2)
Basos: 1 %
EOS (ABSOLUTE): 0 10*3/uL (ref 0.0–0.4)
Eos: 1 %
Hematocrit: 45.1 % (ref 37.5–51.0)
Hemoglobin: 14.7 g/dL (ref 13.0–17.7)
Immature Grans (Abs): 0 10*3/uL (ref 0.0–0.1)
Immature Granulocytes: 0 %
Lymphocytes Absolute: 1.1 10*3/uL (ref 0.7–3.1)
Lymphs: 19 %
MCH: 30.2 pg (ref 26.6–33.0)
MCHC: 32.6 g/dL (ref 31.5–35.7)
MCV: 93 fL (ref 79–97)
Monocytes Absolute: 0.5 10*3/uL (ref 0.1–0.9)
Monocytes: 9 %
Neutrophils Absolute: 4 10*3/uL (ref 1.4–7.0)
Neutrophils: 70 %
Platelets: 117 10*3/uL — ABNORMAL LOW (ref 150–450)
RBC: 4.87 x10E6/uL (ref 4.14–5.80)
RDW: 12.5 % (ref 11.6–15.4)
WBC: 5.7 10*3/uL (ref 3.4–10.8)

## 2024-01-01 LAB — CMP14+EGFR
ALT: 19 [IU]/L (ref 0–44)
AST: 24 [IU]/L (ref 0–40)
Albumin: 4.2 g/dL (ref 3.9–4.9)
Alkaline Phosphatase: 79 [IU]/L (ref 44–121)
BUN/Creatinine Ratio: 8 — ABNORMAL LOW (ref 10–24)
BUN: 9 mg/dL (ref 8–27)
Bilirubin Total: 0.4 mg/dL (ref 0.0–1.2)
CO2: 24 mmol/L (ref 20–29)
Calcium: 9.5 mg/dL (ref 8.6–10.2)
Chloride: 112 mmol/L — ABNORMAL HIGH (ref 96–106)
Creatinine, Ser: 1.11 mg/dL (ref 0.76–1.27)
Globulin, Total: 2.2 g/dL (ref 1.5–4.5)
Glucose: 92 mg/dL (ref 70–99)
Potassium: 4.3 mmol/L (ref 3.5–5.2)
Total Protein: 6.4 g/dL (ref 6.0–8.5)
eGFR: 76 mL/min/{1.73_m2} (ref >59)

## 2024-01-01 LAB — LIPID PANEL
Chol/HDL Ratio: 3.5 {ratio} (ref 0.0–5.0)
Cholesterol, Total: 184 mg/dL (ref 100–199)
HDL: 53 mg/dL (ref >39)
LDL Chol Calc (NIH): 115 mg/dL — ABNORMAL HIGH (ref 0–99)
Triglycerides: 88 mg/dL (ref 0–149)
VLDL Cholesterol Cal: 16 mg/dL (ref 5–40)

## 2024-01-01 LAB — PSA TOTAL (REFLEX TO FREE): Prostate Specific Ag, Serum: 2.5 ng/mL (ref 0.0–4.0)

## 2024-01-05 ENCOUNTER — Telehealth: Payer: Self-pay

## 2024-01-05 NOTE — Telephone Encounter (Signed)
 RX BENEFITS REVIEWED. PA for zepbound  sent to plan.

## 2024-01-20 ENCOUNTER — Other Ambulatory Visit: Payer: Self-pay | Admitting: Nurse Practitioner

## 2024-01-20 DIAGNOSIS — G4733 Obstructive sleep apnea (adult) (pediatric): Secondary | ICD-10-CM

## 2024-01-20 MED ORDER — ZEPBOUND 5 MG/0.5ML ~~LOC~~ SOAJ: 5.0000 mg | SUBCUTANEOUS | 1 refills | Status: DC

## 2024-03-02 ENCOUNTER — Encounter: Payer: Self-pay | Admitting: Nurse Practitioner

## 2024-03-02 ENCOUNTER — Ambulatory Visit (INDEPENDENT_AMBULATORY_CARE_PROVIDER_SITE_OTHER): Payer: Self-pay | Admitting: Nurse Practitioner

## 2024-03-02 VITALS — BP 110/80 | HR 75 | Temp 98.6°F | Ht 70.0 in | Wt 223.4 lb

## 2024-03-02 DIAGNOSIS — E66811 Obesity, class 1: Secondary | ICD-10-CM

## 2024-03-02 DIAGNOSIS — G4733 Obstructive sleep apnea (adult) (pediatric): Secondary | ICD-10-CM

## 2024-03-02 DIAGNOSIS — Z6832 Body mass index (BMI) 32.0-32.9, adult: Secondary | ICD-10-CM

## 2024-03-02 DIAGNOSIS — E6609 Other obesity due to excess calories: Secondary | ICD-10-CM

## 2024-03-02 NOTE — Progress Notes (Signed)
 [place], CMA,acting as a Neurosurgeon for Jeffrey Epley, FNP.,have documented all relevant documentation on the behalf of Jeffrey Epley, FNP,as directed by  Jeffrey Epley, FNP while in the presence of Jeffrey Epley, FNP.  Subjective:  Patient ID: Jeffrey Barton , male    DOB: Oct 15, 1961 , 63 y.o.   MRN: [id number]  Chief Complaint  Patient presents with   Obesity    HPI  Patient presents today for a weight follow up.  Patient reports compliance with medication. Patient denies any chest pain, SOB, or headaches. Patient has no concerns today. He is walking daily at work. He is not doing any extra walking outside of work. He is doing smoothies and eat lunch and will not be hungry afterwards. He is wearing his CPAP machine nightly. He is not eating the oreo stuffed cookies as much.   He has just picked up his last refill on Zepbound. He can tell he has lost weight and      Past Medical History:  Diagnosis Date   Class 2 obesity due to excess calories without serious comorbidity with body mass index (BMI) of 36.0 to 36.9 in adult 03/06/2020   Oxygen deficiency 08/2020   Sleep apnea 08/2020     Family History  Problem Relation Age of Onset   Healthy Mother      Current Outpatient Medications:    tirzepatide (ZEPBOUND) 5 MG/0.5ML Pen, Inject 5 mg into the skin once a week., Disp: 2 mL, Rfl: 1   No Known Allergies   Review of Systems  Constitutional: Negative.   Eyes: Negative.   Respiratory: Negative.    Cardiovascular: Negative.   Musculoskeletal: Negative.   Skin: Negative.   Neurological: Negative.   Psychiatric/Behavioral: Negative.       Today's Vitals   03/02/24 1528  BP: 110/80  Pulse: 75  Temp: 98.6 F (37 C)  TempSrc: Oral  Weight: 223 lb 6.4 oz (101.3 kg)  Height: 5\' 10"  (1.778 m)  PainSc: 0-No pain   Body mass index is 32.05 kg/m.  Wt Readings from Last 3 Encounters:  03/02/24 223 lb 6.4 oz (101.3 kg)  12/25/23 244 lb 3.2 oz (110.8 kg)  07/01/23 244  lb (110.7 kg)    Objective:  Physical Exam Vitals and nursing note reviewed.  Constitutional:      General: He is not in acute distress.    Appearance: Normal appearance.  Cardiovascular:     Rate and Rhythm: Normal rate and regular rhythm.     Pulses: Normal pulses.     Heart sounds: Normal heart sounds. No murmur heard. Pulmonary:     Effort: Pulmonary effort is normal. No respiratory distress.     Breath sounds: Normal breath sounds. No wheezing.  Neurological:     Mental Status: He is alert.         Assessment And Plan:  OSA on CPAP Assessment & Plan: He is going well with zepbound and tolerating well. Will increase dose.    Class 1 obesity due to excess calories without serious comorbidity with body mass index (BMI) of 32.0 to 32.9 in adult Assessment & Plan: He is encouraged to strive for BMI less than 30 to decrease cardiac risk. Advised to aim for at least 150 minutes of exercise per week.      Return for 2 months weight check.  Patient was given opportunity to ask questions. Patient verbalized understanding of the plan and was able to repeat key elements of the plan.  All questions were answered to their satisfaction.    Inge Mangle, FNP, have reviewed all documentation for this visit. The documentation on 03/02/24 for the exam, diagnosis, procedures, and orders are all accurate and complete.   IF YOU HAVE BEEN REFERRED TO A SPECIALIST, IT MAY TAKE 1-2 WEEKS TO SCHEDULE/PROCESS THE REFERRAL. IF YOU HAVE NOT HEARD FROM US /SPECIALIST IN TWO WEEKS, PLEASE GIVE US  A CALL AT [phone].

## 2024-03-02 NOTE — Patient Instructions (Signed)
 You can get protality nutrition shakes to supplement Let us know if you want to increase to 7.5 mg after talking with the pharmacy

## 2024-03-09 NOTE — Assessment & Plan Note (Signed)
 He is encouraged to strive for BMI less than 30 to decrease cardiac risk. Advised to aim for at least 150 minutes of exercise per week.

## 2024-03-09 NOTE — Assessment & Plan Note (Signed)
 He is going well with zepbound and tolerating well. Will increase dose.

## 2024-03-24 ENCOUNTER — Other Ambulatory Visit: Payer: Self-pay | Admitting: Nurse Practitioner

## 2024-03-24 DIAGNOSIS — G4733 Obstructive sleep apnea (adult) (pediatric): Secondary | ICD-10-CM

## 2024-04-14 ENCOUNTER — Other Ambulatory Visit: Payer: Self-pay

## 2024-04-14 DIAGNOSIS — G4733 Obstructive sleep apnea (adult) (pediatric): Secondary | ICD-10-CM

## 2024-04-14 MED ORDER — ZEPBOUND 5 MG/0.5ML ~~LOC~~ SOAJ: 5.0000 mg | SUBCUTANEOUS | 1 refills | Status: DC

## 2024-04-16 ENCOUNTER — Telehealth: Payer: Self-pay

## 2024-04-16 ENCOUNTER — Other Ambulatory Visit: Payer: Self-pay | Admitting: Nurse Practitioner

## 2024-04-16 DIAGNOSIS — G4733 Obstructive sleep apnea (adult) (pediatric): Secondary | ICD-10-CM

## 2024-04-16 MED ORDER — ZEPBOUND 7.5 MG/0.5ML ~~LOC~~ SOAJ: 7.5000 mg | SUBCUTANEOUS | 1 refills | Status: DC

## 2024-04-16 NOTE — Telephone Encounter (Signed)
 Copied from CRM [id number]. Topic: Clinical - Medication Question >> Apr 15, 2024  3:15 PM Lotus Round B wrote: Reason for CRM: pt received a letter from [facility] that the tirzepatide  (ZEPBOUND ) 5 MG/0.5ML Pen will no longer be accepted through his insurance and they gave him 4 options avaliable:   1.Orlistat  2.Qsymia  3.Saxenda  4.HQIONG   If nurse or Dr.Moore can give the pt a call to see which one would be the best medication for him. Spoke with pharmacy and they stated he is still covered for the Zepbound - he just tried to pick up medication early. LVM for patient.

## 2024-05-18 ENCOUNTER — Other Ambulatory Visit: Payer: Self-pay

## 2024-05-18 MED ORDER — WEGOVY 0.5 MG/0.5ML ~~LOC~~ SOAJ: 0.5000 mg | SUBCUTANEOUS | 1 refills | Status: DC

## 2024-05-19 ENCOUNTER — Ambulatory Visit: Admitting: Nurse Practitioner

## 2024-05-26 ENCOUNTER — Telehealth: Payer: Self-pay | Admitting: Nurse Practitioner

## 2024-05-26 NOTE — Telephone Encounter (Signed)
 CALLED PT TO RESCHEDULE 7/9 APPT DUE TO PROVIDER NOT BEING IN THE OFFICE THAT DAY

## 2024-06-02 ENCOUNTER — Ambulatory Visit: Payer: 59 | Admitting: Nurse Practitioner

## 2024-06-16 ENCOUNTER — Other Ambulatory Visit: Payer: Self-pay | Admitting: Nurse Practitioner

## 2024-06-16 DIAGNOSIS — G4733 Obstructive sleep apnea (adult) (pediatric): Secondary | ICD-10-CM

## 2024-06-22 ENCOUNTER — Other Ambulatory Visit (HOSPITAL_COMMUNITY): Payer: Self-pay

## 2024-06-22 ENCOUNTER — Other Ambulatory Visit: Payer: Self-pay

## 2024-06-22 DIAGNOSIS — G4733 Obstructive sleep apnea (adult) (pediatric): Secondary | ICD-10-CM

## 2024-06-22 MED ORDER — ZEPBOUND 7.5 MG/0.5ML ~~LOC~~ SOAJ
7.5000 mg | SUBCUTANEOUS | 1 refills | Status: DC
Start: 2024-06-22 — End: 2024-07-15
  Filled 2024-06-22: qty 2, 28d supply, fill #0

## 2024-07-08 ENCOUNTER — Ambulatory Visit: Payer: Self-pay | Admitting: Nurse Practitioner

## 2024-07-08 ENCOUNTER — Encounter: Payer: Self-pay | Admitting: Nurse Practitioner

## 2024-07-08 VITALS — BP 120/60 | HR 78 | Temp 98.9°F | Ht 70.0 in | Wt 210.6 lb

## 2024-07-08 DIAGNOSIS — R39198 Other difficulties with micturition: Secondary | ICD-10-CM

## 2024-07-08 DIAGNOSIS — R109 Unspecified abdominal pain: Secondary | ICD-10-CM | POA: Diagnosis not present

## 2024-07-08 DIAGNOSIS — E6609 Other obesity due to excess calories: Secondary | ICD-10-CM

## 2024-07-08 DIAGNOSIS — E66811 Obesity, class 1: Secondary | ICD-10-CM

## 2024-07-08 DIAGNOSIS — Z6830 Body mass index (BMI) 30.0-30.9, adult: Secondary | ICD-10-CM

## 2024-07-08 DIAGNOSIS — M545 Low back pain, unspecified: Secondary | ICD-10-CM

## 2024-07-08 LAB — POCT URINALYSIS DIP (CLINITEK)
Bilirubin, UA: NEGATIVE
Glucose, UA: NEGATIVE mg/dL
Leukocytes, UA: NEGATIVE
Nitrite, UA: NEGATIVE
POC PROTEIN,UA: NEGATIVE
Spec Grav, UA: 1.02 (ref 1.010–1.025)
Urobilinogen, UA: 0.2 U/dL
pH, UA: 5.5 (ref 5.0–8.0)

## 2024-07-08 NOTE — Patient Instructions (Signed)
 [address] for your xrays.

## 2024-07-08 NOTE — Progress Notes (Signed)
 LILLETTE Kristeen JINNY Gladis, CMA,acting as a Neurosurgeon for Gaines Ada, FNP.,have documented all relevant documentation on the behalf of Gaines Ada, FNP,as directed by  Gaines Ada, FNP while in the presence of Gaines Ada, FNP.  Subjective:  Patient ID: Jeffrey Barton , male    DOB: December 04, 1960 , 63 y.o.   MRN: [id number]  Chief Complaint  Patient presents with   Obesity    Patient presents today for a weight follow up, Patient reports compliance with medication. Patient denies any chest pain, SOB, or headaches.  Patient reports his zepbound  is no longer being covered by his insurance. He never got the wegovy  because he said the zepbound  has giving him stomach pain, hip back, lower back pain, leg sensitive, constipation, and his urine stream is weak. He reports he has had these symptoms the whole time but it is now worse.    HPI  HPI  Discussed the use of AI scribe software for clinical note transcription with the patient, who gave verbal consent to proceed.  History of Present Illness Jeffrey Barton is a 63 year old male who presents with abdominal and back pain following discontinuation of Zepbound .  He has been experiencing significant abdominal and lower back pain, along with hip pain, leg sensitivity, and constipation since discontinuing Zepbound  three weeks ago. The abdominal pain is severe, rated 10 out of 10, worsens with sitting or lying down, and is least bothersome when standing. The pain is located in the front and is exacerbated by movement.  He notes a change in bowel habits, with infrequent bowel movements consisting of small, pebble-like stools. No nausea, vomiting, or fever. He drinks water regularly and works long hours, ranging from nine and a half to eleven hours a day.  He mentions changes in urinary habits, including variable frequency and difficulty initiating urination, but denies any known prostate issues. He describes a general feeling of nausea without the urge to  vomit.  He has not engaged in any new or strenuous physical activities and maintains a consistent breakfast routine of protein and produce, which he has followed even before the onset of symptoms. He feels gas moving around in his abdomen.  He has a history of visiting a urologist for a previous issue described as a 'pallet' causing cramps, but no significant findings were reported. He has not been exercising beyond walking and denies any recent injuries or changes in physical activity.   Past Medical History:  Diagnosis Date   Class 2 obesity due to excess calories without serious comorbidity with body mass index (BMI) of 36.0 to 36.9 in adult 03/06/2020   Oxygen deficiency 08/2020   Sleep apnea 08/2020     Family History  Problem Relation Age of Onset   Healthy Mother      Current Outpatient Medications:    diazepam  (VALIUM ) 5 MG tablet, Take 1 tablet (5 mg total) by mouth 2 (two) times daily as needed for muscle spasms., Disp: 10 tablet, Rfl: 0   ibuprofen  (ADVIL ) 600 MG tablet, Take 1 tablet (600 mg total) by mouth every 6 (six) hours as needed., Disp: 30 tablet, Rfl: 0   methylPREDNISolone  (MEDROL  DOSEPAK) 4 MG TBPK tablet, Take by mouth daily. Day 1:  2 pills at breakfast, 1 pill at lunch, 1 pill after supper, 2 pills at bedtime;Day 2:  1 pill at breakfast, 1 pill at lunch, 1 pill after supper, 2 pills at bedtime;Day 3:  1 pill at breakfast, 1 pill at lunch, 1 pill  after supper, 1 pill at bedtime;Day 4:  1 pill at breakfast, 1 pill at lunch, 1 pill at bedtime;Day 5:  1 pill at breakfast, 1 pill at bedtime;Day 6:  1 pill at breakfast (Patient not taking: Reported on 07/15/2024), Disp: 21 tablet, Rfl: 0   oxyCODONE -acetaminophen  (PERCOCET/ROXICET) 5-325 MG tablet, Take 1 tablet by mouth every 6 (six) hours as needed for severe pain (pain score 7-10)., Disp: 20 tablet, Rfl: 0   No Known Allergies   Review of Systems  Constitutional: Negative.   Eyes: Negative.   Respiratory:  Negative.    Cardiovascular: Negative.   Musculoskeletal: Negative.   Skin: Negative.   Neurological: Negative.   Psychiatric/Behavioral: Negative.       Today's Vitals   07/08/24 0945  BP: 120/60  Pulse: 78  Temp: 98.9 F (37.2 C)  TempSrc: Oral  Weight: 210 lb 9.6 oz (95.5 kg)  Height: 5' 10 (1.778 m)  PainSc: 10-Worst pain ever  PainLoc: Generalized   Body mass index is 30.22 kg/m.  Wt Readings from Last 3 Encounters:  07/15/24 204 lb (92.5 kg)  07/08/24 210 lb 9.6 oz (95.5 kg)  03/02/24 223 lb 6.4 oz (101.3 kg)      Objective:  Physical Exam Vitals and nursing note reviewed.  Constitutional:      General: He is not in acute distress.    Appearance: Normal appearance.  Cardiovascular:     Rate and Rhythm: Normal rate and regular rhythm.     Pulses: Normal pulses.     Heart sounds: Normal heart sounds. No murmur heard. Pulmonary:     Effort: Pulmonary effort is normal. No respiratory distress.     Breath sounds: Normal breath sounds. No wheezing.  Abdominal:     General: Bowel sounds are normal. There is no distension.     Palpations: Abdomen is soft.     Tenderness: There is abdominal tenderness.  Musculoskeletal:        General: No tenderness.     Comments: He has some guarded ambulation, sensitivity to left thigh area  Skin:    General: Skin is warm and dry.     Capillary Refill: Capillary refill takes less than 2 seconds.  Neurological:     General: No focal deficit present.     Mental Status: He is alert and oriented to person, place, and time.     Cranial Nerves: No cranial nerve deficit.     Motor: No weakness.  Psychiatric:        Mood and Affect: Mood normal.        Behavior: Behavior normal.        Thought Content: Thought content normal.        Judgment: Judgment normal.     Assessment And Plan:  Pain in abdomen on palpation Assessment & Plan: Severe abdominal pain with constipation and urinary symptoms. Differential includes UTI and  referred pain from testicular issues. No recent urology consultation. - Order x-ray of the abdomen - Order blood work. - Check urine for urinary tract infection, showed small protein and trace blood, sent urine culture. - Recommended he call the urologist if symptoms persist.  Orders: -     DG Abd 1 View; Future -     CMP14+EGFR -     CBC -     DG Lumbar Spine Complete; Future  Acute left-sided low back pain without sciatica Assessment & Plan: Lower back pain affecting mobility. Reports a sensation to his left thigh -  Order x-ray of the back.   Decreased urine stream -     PSA -     POCT URINALYSIS DIP (CLINITEK) -     Urine Culture  Class 1 obesity due to excess calories with body mass index (BMI) of 30.0 to 30.9 in adult, unspecified whether serious comorbidity present Assessment & Plan: Previously on Zepbound , planned switch to Wegovy  delayed due to current symptoms. - Hold off on Wegovy .     Return for keep same next.  Patient was given opportunity to ask questions. Patient verbalized understanding of the plan and was able to repeat key elements of the plan. All questions were answered to their satisfaction.    LILLETTE Gaines Ada, FNP, have reviewed all documentation for this visit. The documentation on 07/08/24 for the exam, diagnosis, procedures, and orders are all accurate and complete.   IF YOU HAVE BEEN REFERRED TO A SPECIALIST, IT MAY TAKE 1-2 WEEKS TO SCHEDULE/PROCESS THE REFERRAL. IF YOU HAVE NOT HEARD FROM US /SPECIALIST IN TWO WEEKS, PLEASE GIVE US  A CALL AT [phone].

## 2024-07-09 ENCOUNTER — Ambulatory Visit
Admission: RE | Admit: 2024-07-09 | Discharge: 2024-07-09 | Disposition: A | Discharge status: Home / Self Care | Source: Ambulatory Visit | Attending: Nurse Practitioner | Admitting: Nurse Practitioner

## 2024-07-09 DIAGNOSIS — R109 Unspecified abdominal pain: Secondary | ICD-10-CM

## 2024-07-09 LAB — CMP14+EGFR
ALT: 23 IU/L (ref 0–44)
AST: 22 IU/L (ref 0–40)
Albumin: 4.4 g/dL (ref 3.9–4.9)
Alkaline Phosphatase: 70 IU/L (ref 44–121)
BUN/Creatinine Ratio: 12 (ref 10–24)
BUN: 14 mg/dL (ref 8–27)
Bilirubin Total: 0.6 mg/dL (ref 0.0–1.2)
CO2: 24 mmol/L (ref 20–29)
Calcium: 9.8 mg/dL (ref 8.6–10.2)
Chloride: 107 mmol/L — ABNORMAL HIGH (ref 96–106)
Creatinine, Ser: 1.2 mg/dL (ref 0.76–1.27)
Globulin, Total: 2.7 g/dL (ref 1.5–4.5)
Glucose: 95 mg/dL (ref 70–99)
Potassium: 4.3 mmol/L (ref 3.5–5.2)
Sodium: 147 mmol/L — ABNORMAL HIGH (ref 134–144)
Total Protein: 7.1 g/dL (ref 6.0–8.5)
eGFR: 69 mL/min/1.73 (ref >59)

## 2024-07-09 LAB — CBC
Hematocrit: 51.6 % — ABNORMAL HIGH (ref 37.5–51.0)
Hemoglobin: 16.4 g/dL (ref 13.0–17.7)
MCH: 30.1 pg (ref 26.6–33.0)
MCHC: 31.8 g/dL (ref 31.5–35.7)
MCV: 95 fL (ref 79–97)
Platelets: 138 x10E3/uL — ABNORMAL LOW (ref 150–450)
RBC: 5.44 x10E6/uL (ref 4.14–5.80)
RDW: 12.7 % (ref 11.6–15.4)
WBC: 7.3 x10E3/uL (ref 3.4–10.8)

## 2024-07-09 LAB — PSA: Prostate Specific Ag, Serum: 1.9 ng/mL (ref 0.0–4.0)

## 2024-07-10 ENCOUNTER — Other Ambulatory Visit: Payer: Self-pay

## 2024-07-10 ENCOUNTER — Emergency Department (HOSPITAL_COMMUNITY)

## 2024-07-10 ENCOUNTER — Emergency Department (HOSPITAL_COMMUNITY)
Admission: EM | Admit: 2024-07-10 | Discharge: 2024-07-10 | Disposition: A | Discharge status: Home / Self Care | Attending: Emergency Medicine | Admitting: Emergency Medicine

## 2024-07-10 ENCOUNTER — Encounter (HOSPITAL_COMMUNITY): Payer: Self-pay

## 2024-07-10 DIAGNOSIS — N2889 Other specified disorders of kidney and ureter: Secondary | ICD-10-CM | POA: Insufficient documentation

## 2024-07-10 DIAGNOSIS — R509 Fever, unspecified: Secondary | ICD-10-CM | POA: Insufficient documentation

## 2024-07-10 DIAGNOSIS — R1032 Left lower quadrant pain: Secondary | ICD-10-CM | POA: Diagnosis present

## 2024-07-10 DIAGNOSIS — M5430 Sciatica, unspecified side: Secondary | ICD-10-CM | POA: Insufficient documentation

## 2024-07-10 LAB — I-STAT CHEM 8, ED
BUN: 11 mg/dL (ref 8–23)
Calcium, Ion: 1.19 mmol/L (ref 1.15–1.40)
Chloride: 107 mmol/L (ref 98–111)
Creatinine, Ser: 1.3 mg/dL — ABNORMAL HIGH (ref 0.61–1.24)
Glucose, Bld: 94 mg/dL (ref 70–99)
HCT: 50 % (ref 39.0–52.0)
Hemoglobin: 17 g/dL (ref 13.0–17.0)
Potassium: 3.7 mmol/L (ref 3.5–5.1)
Sodium: 147 mmol/L — ABNORMAL HIGH (ref 135–145)
TCO2: 26 mmol/L (ref 22–32)

## 2024-07-10 LAB — COMPREHENSIVE METABOLIC PANEL WITH GFR
ALT: 26 U/L (ref 0–44)
AST: 32 U/L (ref 15–41)
Albumin: 4.2 g/dL (ref 3.5–5.0)
Alkaline Phosphatase: 56 U/L (ref 38–126)
Anion gap: 12 (ref 5–15)
BUN: 10 mg/dL (ref 8–23)
CO2: 24 mmol/L (ref 22–32)
Calcium: 9.3 mg/dL (ref 8.9–10.3)
Chloride: 109 mmol/L (ref 98–111)
Creatinine, Ser: 1.29 mg/dL — ABNORMAL HIGH (ref 0.61–1.24)
GFR, Estimated: >60 mL/min (ref >60)
Glucose, Bld: 96 mg/dL (ref 70–99)
Potassium: 3.8 mmol/L (ref 3.5–5.1)
Sodium: 145 mmol/L (ref 135–145)
Total Bilirubin: 1 mg/dL (ref 0.0–1.2)
Total Protein: 7.5 g/dL (ref 6.5–8.1)

## 2024-07-10 LAB — URINALYSIS, W/ REFLEX TO CULTURE (INFECTION SUSPECTED)
Bacteria, UA: NONE SEEN
Bilirubin Urine: NEGATIVE
Glucose, UA: NEGATIVE mg/dL
Hgb urine dipstick: NEGATIVE
Ketones, ur: 20 mg/dL — AB
Leukocytes,Ua: NEGATIVE
Nitrite: NEGATIVE
Protein, ur: NEGATIVE mg/dL
Specific Gravity, Urine: 1.015 (ref 1.005–1.030)
pH: 5 (ref 5.0–8.0)

## 2024-07-10 LAB — RESP PANEL BY RT-PCR (RSV, FLU A&B, COVID)  RVPGX2
Influenza A by PCR: NEGATIVE
Influenza B by PCR: NEGATIVE
Resp Syncytial Virus by PCR: NEGATIVE
SARS Coronavirus 2 by RT PCR: NEGATIVE

## 2024-07-10 LAB — CBC WITH DIFFERENTIAL/PLATELET
Abs Immature Granulocytes: 0.02 K/uL (ref 0.00–0.07)
Basophils Absolute: 0.1 K/uL (ref 0.0–0.1)
Basophils Relative: 1 %
Eosinophils Absolute: 0 K/uL (ref 0.0–0.5)
Eosinophils Relative: 0 %
HCT: 50 % (ref 39.0–52.0)
Hemoglobin: 17 g/dL (ref 13.0–17.0)
Immature Granulocytes: 0 %
Lymphocytes Relative: 15 %
Lymphs Abs: 1.2 K/uL (ref 0.7–4.0)
MCH: 31 pg (ref 26.0–34.0)
MCHC: 34 g/dL (ref 30.0–36.0)
MCV: 91.1 fL (ref 80.0–100.0)
Monocytes Absolute: 0.7 K/uL (ref 0.1–1.0)
Monocytes Relative: 9 %
Neutro Abs: 5.9 K/uL (ref 1.7–7.7)
Neutrophils Relative %: 75 %
Platelets: 127 K/uL — ABNORMAL LOW (ref 150–400)
RBC: 5.49 MIL/uL (ref 4.22–5.81)
RDW: 12.8 % (ref 11.5–15.5)
WBC: 7.8 K/uL (ref 4.0–10.5)
nRBC: 0 % (ref 0.0–0.2)

## 2024-07-10 LAB — I-STAT CG4 LACTIC ACID, ED: Lactic Acid, Venous: 1.6 mmol/L (ref 0.5–1.9)

## 2024-07-10 LAB — LIPASE, BLOOD: Lipase: 51 U/L (ref 11–51)

## 2024-07-10 LAB — URINE CULTURE: Organism ID, Bacteria: NO GROWTH

## 2024-07-10 LAB — SEDIMENTATION RATE: Sed Rate: 3 mm/h (ref 0–16)

## 2024-07-10 LAB — C-REACTIVE PROTEIN: CRP: <0.5 mg/dL (ref <1.0)

## 2024-07-10 MED ORDER — OXYCODONE-ACETAMINOPHEN 5-325 MG PO TABS: 1.0000 | ORAL_TABLET | Freq: Four times a day (QID) | ORAL | 0 refills | Status: DC | PRN

## 2024-07-10 MED ORDER — IBUPROFEN 600 MG PO TABS: 600.0000 mg | ORAL_TABLET | Freq: Four times a day (QID) | ORAL | 0 refills | Status: AC | PRN

## 2024-07-10 MED ORDER — GADOBUTROL 1 MMOL/ML IV SOLN
9.5000 mL | Freq: Once | INTRAVENOUS | Status: AC | PRN
  Administered 2024-07-10: 9.5 mL via INTRAVENOUS

## 2024-07-10 MED ORDER — DIAZEPAM 5 MG PO TABS
5.0000 mg | ORAL_TABLET | Freq: Two times a day (BID) | ORAL | 0 refills | Status: DC | PRN
Start: 2024-07-10 — End: 2024-07-10

## 2024-07-10 MED ORDER — IBUPROFEN 600 MG PO TABS: 600.0000 mg | ORAL_TABLET | Freq: Four times a day (QID) | ORAL | 0 refills | Status: DC | PRN

## 2024-07-10 MED ORDER — METHYLPREDNISOLONE 4 MG PO TBPK: ORAL_TABLET | Freq: Every day | ORAL | 0 refills | Status: DC

## 2024-07-10 MED ORDER — OXYCODONE-ACETAMINOPHEN 5-325 MG PO TABS
1.0000 | ORAL_TABLET | Freq: Four times a day (QID) | ORAL | 0 refills | Status: DC | PRN
Start: 2024-07-10 — End: 2024-07-15

## 2024-07-10 MED ORDER — HYDROMORPHONE HCL 1 MG/ML IJ SOLN
1.0000 mg | Freq: Once | INTRAMUSCULAR | Status: AC
  Administered 2024-07-10: 1 mg via INTRAVENOUS
  Filled 2024-07-10: qty 1

## 2024-07-10 MED ORDER — IOHEXOL 350 MG/ML SOLN
75.0000 mL | Freq: Once | INTRAVENOUS | Status: AC | PRN
  Administered 2024-07-10: 75 mL via INTRAVENOUS

## 2024-07-10 MED ORDER — MORPHINE SULFATE (PF) 4 MG/ML IV SOLN
4.0000 mg | Freq: Once | INTRAVENOUS | Status: AC
  Administered 2024-07-10: 4 mg via INTRAVENOUS
  Filled 2024-07-10: qty 1

## 2024-07-10 MED ORDER — DEXAMETHASONE SODIUM PHOSPHATE 10 MG/ML IJ SOLN
10.0000 mg | Freq: Once | INTRAMUSCULAR | Status: AC
  Administered 2024-07-10: 10 mg via INTRAVENOUS
  Filled 2024-07-10: qty 1

## 2024-07-10 MED ORDER — METHYLPREDNISOLONE 4 MG PO TBPK: ORAL_TABLET | Freq: Every day | ORAL | 0 refills | Status: AC

## 2024-07-10 MED ORDER — DIAZEPAM 5 MG PO TABS: 5.0000 mg | ORAL_TABLET | Freq: Two times a day (BID) | ORAL | 0 refills | Status: DC | PRN

## 2024-07-10 NOTE — ED Provider Notes (Signed)
 [place] EMERGENCY DEPARTMENT AT [facility] Provider Note   CSN: [id number] Arrival date & time: 07/10/24  9450     Patient presents with: Hip/Thigh pain   Jeffrey Barton is a 63 y.o. male.   63 year old male with prior medical history as detailed below presents for evaluation.  Patient reports constant left lower quadrant left hip discomfort x 3 weeks.  This is been an ongoing issue.  Patient reports that over the last 3 days his symptoms have worsened.  Patient reports that he has numbness and tingling extending from his left low back to his left knee.  He denies muscular weakness.  He reports that his left lower quadrant of his abdomen is sore.  He reports that he occasionally has difficulty producing both urine and BMs.  He is ambulatory without difficulty.  He denies nausea or vomiting.  He denies fever.  The history is provided by the patient and medical records.       Prior to Admission medications   Medication Sig Start Date End Date Taking? Authorizing Provider  Semaglutide -Weight Management (WEGOVY ) 0.5 MG/0.5ML SOAJ Inject 0.5 mg into the skin once a week. Patient not taking: Reported on 07/08/2024 05/18/24   Georgina Speaks, FNP  tirzepatide  (ZEPBOUND ) 5 MG/0.5ML Pen Inject 5 mg into the skin once a week. Patient not taking: Reported on 07/08/2024 04/14/24   Georgina Speaks, FNP  tirzepatide  (ZEPBOUND ) 7.5 MG/0.5ML Pen Inject 7.5 mg into the skin once a week. Patient not taking: Reported on 07/08/2024 06/22/24   Georgina Speaks, FNP    Allergies: Patient has no known allergies.    Review of Systems  All other systems reviewed and are negative.   Updated Vital Signs BP (!) 140/93 (BP Location: Right Arm)   Pulse 81   Temp 99.3 F (37.4 C) (Oral)   Resp 16   SpO2 96%   Physical Exam Vitals and nursing note reviewed.  Constitutional:      General: He is not in acute distress.    Appearance: Normal appearance. He is well-developed.  HENT:     Head:  Normocephalic and atraumatic.  Eyes:     Conjunctiva/sclera: Conjunctivae normal.     Pupils: Pupils are equal, round, and reactive to light.  Cardiovascular:     Rate and Rhythm: Normal rate and regular rhythm.     Heart sounds: Normal heart sounds.  Pulmonary:     Effort: Pulmonary effort is normal. No respiratory distress.     Breath sounds: Normal breath sounds.  Abdominal:     General: There is no distension.     Palpations: Abdomen is soft.     Tenderness: There is no abdominal tenderness.     Comments: Mild tenderness elicited with deep palpation of the left lower quadrant.  No rebound.  No guarding.  No appreciable mass.  No appreciable inguinal hernias.  Normal male genitalia noted.  Musculoskeletal:        General: No deformity. Normal range of motion.     Cervical back: Normal range of motion and neck supple.     Comments: Straight leg raise elicits pain in his left lower back when his left leg is raised above 30 degrees.  Distal bilateral lower extremities are neurovascular intact.  Patient with normal gait.  Skin:    General: Skin is warm and dry.  Neurological:     General: No focal deficit present.     Mental Status: He is alert and oriented to person, place,  and time.     (all labs ordered are listed, but only abnormal results are displayed) Labs Reviewed  URINALYSIS, W/ REFLEX TO CULTURE (INFECTION SUSPECTED)  COMPREHENSIVE METABOLIC PANEL WITH GFR  LIPASE, BLOOD  CBC WITH DIFFERENTIAL/PLATELET  I-STAT CHEM 8, ED    EKG: None  Radiology: No results found.   Procedures   Medications Ordered in the ED - No data to display                                  Medical Decision Making Patient reports several weeks of left-sided low back, left hip, left lower quadrant abdominal pain.  Exam is perhaps most suggestive of likely sciatica.  However, given abdominal pain, workup initiated.  Initial screening labs are reassuring with significant hematologic  abnormality.  CT imaging is concerning for lesion in kidney suggestive of possible malignancy.  This will require outpatient workup.  Patient was noted to be febrile during evaluation.  He appeared to be asymptomatic from this.  Given patient's symptoms and noted fever, MRI ordered and is pending at time of signout.  Oncoming EDP is aware of case.  Amount and/or Complexity of Data Reviewed Labs: ordered. Radiology: ordered.  Risk Prescription drug management.        Final diagnoses:  Renal mass, left  Acute sciatica  Fever, unspecified fever cause    ED Discharge Orders     None          Laurice Maude BROCKS, MD 07/11/24 [phone]

## 2024-07-10 NOTE — ED Triage Notes (Signed)
 Pt is coming in for left hip and thigh pain he has had for around 3 weeks, he mentions it was after he had stopped taking his zepbound  due to insurance reasons of some sort. He mentions the pain is in his left hip/thihg area. No swelling, no trauma to the area. He is otherwise stable with no shortness of breath or chest pain. He is not on blood thinners.

## 2024-07-10 NOTE — ED Notes (Signed)
 Patient requesting to eat/drink. Message sent to MD regarding patient clearance. Awaiting response at this time.

## 2024-07-10 NOTE — ED Notes (Signed)
 Phlebotomy at bedside for blood cultures/lab work.

## 2024-07-10 NOTE — ED Notes (Signed)
 ED Provider at bedside.

## 2024-07-10 NOTE — ED Provider Notes (Signed)
 Pt signed out by Dr. Laurice pending MRIs and CXR.  Films reviewed by me.  I agree with the radiologist.  CXR: No acute intrathoracic process.   MRI Lumbar:  . No evidence of discitis/osteomyelitis.  2. 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  3. Small enhancing focus in the L3 vertebral body, indeterminate.  Considerations include both benign etiologies (such as atypical  hemangioma) and metastatic disease or multiple myeloma. Please see  today's CT abdomen and pelvis report for recommendations regarding  further workup of a left renal mass.        MRI hip: Wet read with nothing suspicious for osteomyelitis.  No synovial enhancement.  Pt given iv decadron  and dilaudid  for the multiple disc bulgings.  He needs to f/u with NS.  He is told of the findings on his kidney suspicious for RCC.  He is to f/u with urology.     Dean Clarity, MD 07/10/24 [phone]

## 2024-07-10 NOTE — ED Notes (Signed)
 Patient heart rate noted to increase to >115 when patient changes positions. Pt denies chest pain. EKG exported and MD notified.

## 2024-07-12 ENCOUNTER — Ambulatory Visit: Payer: Self-pay | Admitting: Nurse Practitioner

## 2024-07-12 ENCOUNTER — Telehealth: Payer: Self-pay

## 2024-07-12 NOTE — Transitions of Care (Post Inpatient/ED Visit) (Signed)
 07/12/2024  Name: Jeffrey Barton MRN: [id number] DOB: 22-Apr-1961  Today's TOC FU Call Status: Today's TOC FU Call Status:: Successful TOC FU Call Completed TOC FU Call Complete Date: 07/12/24 Patient's Name and Date of Birth confirmed.  Transition Care Management Follow-up Telephone Call Date of Discharge: 07/10/24 Discharge Facility: [facility]) Type of Discharge: Inpatient Admission Primary Inpatient Discharge Diagnosis:: Renal mass, left How have you been since you were released from the hospital?: Same Any questions or concerns?: No  Items Reviewed: Did you receive and understand the discharge instructions provided?: Yes Medications obtained,verified, and reconciled?: Yes (Medications Reviewed) Any new allergies since your discharge?: No Dietary orders reviewed?: No Do you have support at home?: Yes People in Home [RPT]: spouse  Medications Reviewed Today: Medications Reviewed Today     Reviewed by Kingsley Farace J, CMA (Certified Medical Assistant) on 07/12/24 at [phone]  Med List Status: <None>   Medication Order Taking? Sig Documenting Provider Last Dose Status Informant  diazepam  (VALIUM ) 5 MG tablet [id number] Yes Take 1 tablet (5 mg total) by mouth 2 (two) times daily as needed for muscle spasms. Dean Clarity, MD  Active   ibuprofen  (ADVIL ) 600 MG tablet [id number] Yes Take 1 tablet (600 mg total) by mouth every 6 (six) hours as needed. Dean Clarity, MD  Active   methylPREDNISolone  (MEDROL  DOSEPAK) 4 MG TBPK tablet [id number] Yes Take by mouth daily. Day 1:  2 pills at breakfast, 1 pill at lunch, 1 pill after supper, 2 pills at bedtime;Day 2:  1 pill at breakfast, 1 pill at lunch, 1 pill after supper, 2 pills at bedtime;Day 3:  1 pill at breakfast, 1 pill at lunch, 1 pill after supper, 1 pill at bedtime;Day 4:  1 pill at breakfast, 1 pill at lunch, 1 pill at bedtime;Day 5:  1 pill at breakfast, 1 pill at bedtime;Day 6:  1 pill at breakfast Haviland, Julie, MD  Active    oxyCODONE -acetaminophen  (PERCOCET/ROXICET) 5-325 MG tablet [id number] Yes Take 1 tablet by mouth every 6 (six) hours as needed for severe pain (pain score 7-10). Dean Clarity, MD  Active   Semaglutide -Weight Management (WEGOVY ) 0.5 MG/0.5ML EMMANUEL [id number]  Inject 0.5 mg into the skin once a week.  Patient not taking: Reported on 07/12/2024   Georgina Speaks, FNP  Active   tirzepatide  (ZEPBOUND ) 5 MG/0.5ML Pen [id number]  Inject 5 mg into the skin once a week.  Patient not taking: Reported on 07/12/2024   Georgina Speaks, FNP  Active   tirzepatide  (ZEPBOUND ) 7.5 MG/0.5ML Pen [id number]  Inject 7.5 mg into the skin once a week.  Patient not taking: Reported on 07/12/2024   Georgina Speaks, FNP  Active             Home Care and Equipment/Supplies: Were Home Health Services Ordered?: No Any new equipment or medical supplies ordered?: No  Functional Questionnaire: Do you need assistance with bathing/showering or dressing?: No Do you need assistance with meal preparation?: No Do you need assistance with eating?: No Do you have difficulty maintaining continence: No Do you need assistance with getting out of bed/getting out of a chair/moving?: No Do you have difficulty managing or taking your medications?: No  Follow up appointments reviewed: PCP Follow-up appointment confirmed?: Yes Date of PCP follow-up appointment?: 07/13/24 Follow-up Provider: [facility] Follow-up appointment confirmed?: Yes Do you need transportation to your follow-up appointment?: No Do you understand care options if your condition(s) worsen?: Yes-patient verbalized understanding    SIGNATURE  Kristeen Lunger, CMA

## 2024-07-13 ENCOUNTER — Inpatient Hospital Stay: Payer: Self-pay | Admitting: Nurse Practitioner

## 2024-07-14 ENCOUNTER — Encounter: Payer: Self-pay | Admitting: Nurse Practitioner

## 2024-07-15 ENCOUNTER — Ambulatory Visit: Admitting: Nurse Practitioner

## 2024-07-15 ENCOUNTER — Encounter: Payer: Self-pay | Admitting: Nurse Practitioner

## 2024-07-15 VITALS — BP 102/70 | HR 97 | Temp 100.2°F | Ht 70.0 in | Wt 204.0 lb

## 2024-07-15 DIAGNOSIS — M5126 Other intervertebral disc displacement, lumbar region: Secondary | ICD-10-CM

## 2024-07-15 DIAGNOSIS — N2889 Other specified disorders of kidney and ureter: Secondary | ICD-10-CM | POA: Diagnosis not present

## 2024-07-15 DIAGNOSIS — R634 Abnormal weight loss: Secondary | ICD-10-CM | POA: Diagnosis not present

## 2024-07-15 DIAGNOSIS — K5900 Constipation, unspecified: Secondary | ICD-10-CM

## 2024-07-15 DIAGNOSIS — Z09 Encounter for follow-up examination after completed treatment for conditions other than malignant neoplasm: Secondary | ICD-10-CM

## 2024-07-15 LAB — CULTURE, BLOOD (ROUTINE X 2)
Culture: NO GROWTH
Culture: NO GROWTH
Special Requests: ADEQUATE

## 2024-07-15 MED ORDER — DIAZEPAM 5 MG PO TABS: 5.0000 mg | ORAL_TABLET | Freq: Two times a day (BID) | ORAL | 0 refills | Status: AC | PRN

## 2024-07-15 MED ORDER — OXYCODONE-ACETAMINOPHEN 5-325 MG PO TABS: 1.0000 | ORAL_TABLET | Freq: Four times a day (QID) | ORAL | 0 refills | Status: DC | PRN

## 2024-07-15 NOTE — Progress Notes (Signed)
 LILLETTE Kristeen JINNY Gladis, CMA,acting as a Neurosurgeon for Gaines Ada, FNP.,have documented all relevant documentation on the behalf of Gaines Ada, FNP,as directed by  Gaines Ada, FNP while in the presence of Gaines Ada, FNP.  Subjective:  Patient ID: Jeffrey Barton , male    DOB: 05/07/1961 , 63 y.o.   MRN: [id number]  Chief Complaint  Patient presents with   Renal Mass    Patient presents today for a ER follow up, patient went to ER on 07/10/2024 for a Renal Mass. Patient reports he is still having pain and would a refill on his meds. He would like FMLA forms completed.    HPI Discussed the use of AI scribe software for clinical note transcription with the patient, who gave verbal consent to proceed.  History of Present Illness Jeffrey Barton is a 63 year old male who presents for ER f/u after having severe back pain with leg pain and abdomen pain.  He has been experiencing severe pain in his left leg, described as 'tingly' and 'irritated' when touched or bumped. The pain began last Thursday and has not affected his right leg. He rates the pain as a ten out of ten and states it has not improved. He visited the emergency room last Friday due to the severity of the pain and reports that he was told he had pinched nerves and bulging discs. Despite taking pain medication regularly, he finds it insufficient for pain management.  He has a history of a slow urine stream, which led to a urology consultation. During imaging, a spot was found on his kidney, initially considered incidental. He has an upcoming appointment with a urologist to further evaluate this finding.  He experiences constipation and regularly drinks prune juice to alleviate this. He reports difficulty with bowel movements, describing a recent episode as a 'six flusher'.  He has lost six pounds in the past week, attributing this to a decreased appetite. He is not currently taking Wegovy  or Zepbound , which he previously used. He drinks at  least half a gallon of water daily, consumes a lot of fruit, and drinks protein to maintain his nutrition.   Wt Readings from Last 3 Encounters: 07/15/24 : 204 lb (92.5 kg) 07/08/24 : 210 lb 9.6 oz (95.5 kg) 03/02/24 : 223 lb 6.4 oz (101.3 kg)      Past Medical History:  Diagnosis Date   Class 2 obesity due to excess calories without serious comorbidity with body mass index (BMI) of 36.0 to 36.9 in adult 03/06/2020   Oxygen deficiency 08/2020   Sleep apnea 08/2020     Family History  Problem Relation Age of Onset   Healthy Mother      Current Outpatient Medications:    ibuprofen  (ADVIL ) 600 MG tablet, Take 1 tablet (600 mg total) by mouth every 6 (six) hours as needed., Disp: 30 tablet, Rfl: 0   diazepam  (VALIUM ) 5 MG tablet, Take 1 tablet (5 mg total) by mouth 2 (two) times daily as needed for muscle spasms., Disp: 10 tablet, Rfl: 0   methylPREDNISolone  (MEDROL  DOSEPAK) 4 MG TBPK tablet, Take by mouth daily. Day 1:  2 pills at breakfast, 1 pill at lunch, 1 pill after supper, 2 pills at bedtime;Day 2:  1 pill at breakfast, 1 pill at lunch, 1 pill after supper, 2 pills at bedtime;Day 3:  1 pill at breakfast, 1 pill at lunch, 1 pill after supper, 1 pill at bedtime;Day 4:  1 pill at breakfast, 1 pill  at lunch, 1 pill at bedtime;Day 5:  1 pill at breakfast, 1 pill at bedtime;Day 6:  1 pill at breakfast (Patient not taking: Reported on 07/15/2024), Disp: 21 tablet, Rfl: 0   oxyCODONE -acetaminophen  (PERCOCET/ROXICET) 5-325 MG tablet, Take 1 tablet by mouth every 6 (six) hours as needed for severe pain (pain score 7-10)., Disp: 20 tablet, Rfl: 0   No Known Allergies   Review of Systems  Constitutional: Negative.   Eyes: Negative.   Respiratory: Negative.    Cardiovascular: Negative.   Musculoskeletal: Negative.   Skin: Negative.   Neurological: Negative.   Psychiatric/Behavioral: Negative.       Today's Vitals   07/15/24 1441  BP: 102/70  Pulse: 97  Temp: 100.2 F (37.9 C)   TempSrc: Oral  Weight: 204 lb (92.5 kg)  Height: 5' 10 (1.778 m)  PainSc: 10-Worst pain ever  PainLoc: Leg   Body mass index is 29.27 kg/m.  Wt Readings from Last 3 Encounters:  07/15/24 204 lb (92.5 kg)  07/08/24 210 lb 9.6 oz (95.5 kg)  03/02/24 223 lb 6.4 oz (101.3 kg)      Objective:  Physical Exam Vitals and nursing note reviewed.  Constitutional:      General: He is not in acute distress.    Appearance: Normal appearance.  Cardiovascular:     Rate and Rhythm: Normal rate and regular rhythm.     Pulses: Normal pulses.     Heart sounds: Normal heart sounds. No murmur heard. Pulmonary:     Effort: Pulmonary effort is normal. No respiratory distress.     Breath sounds: Normal breath sounds. No wheezing.  Abdominal:     General: Abdomen is flat. Bowel sounds are normal. There is no distension.     Palpations: Abdomen is soft.     Tenderness: There is no abdominal tenderness.  Musculoskeletal:        General: No tenderness.     Comments: He has some guarded ambulation  Skin:    General: Skin is warm and dry.     Capillary Refill: Capillary refill takes less than 2 seconds.  Neurological:     General: No focal deficit present.     Mental Status: He is alert and oriented to person, place, and time.     Cranial Nerves: No cranial nerve deficit.     Motor: No weakness.  Psychiatric:        Mood and Affect: Mood normal.        Behavior: Behavior normal.        Thought Content: Thought content normal.        Judgment: Judgment normal.         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 Persistent severe pain. Neurologist identified bulging discs. Pain management prioritized. - Attend follow-up with neurologist for potential injection therapy. - Discuss potential for physical therapy with neurologist. - Obtain note  for work absence for one week. - Document duration of work absence for [employer].  Orders: -     diazePAM ; Take 1 tablet (5 mg total) by mouth 2 (two) times daily as needed for muscle spasms.  Dispense: 10 tablet; Refill: 0  Renal mass, left Assessment & Plan: Incidental renal mass found during imaging for slow urinary stream. Differential includes cyst, blur, or tumor. Pending urologist evaluation. - Attend urologist appointment for further evaluation.   Constipation, unspecified constipation  type Assessment & Plan: Chronic constipation improved with prune juice. Discontinued Wegovy  and Zepbound . - Continue prune juice intake for bowel regularity.   Weight loss Assessment & Plan: Unintentional 6-pound weight loss in one week. Potential causes include decreased appetite or underlying conditions related to renal mass. Discussed concerns about rapid weight loss. - Increase protein intake through diet or supplements. - Monitor weight changes.      Return if symptoms worsen or fail to improve.  Patient was given opportunity to ask questions. Patient verbalized understanding of the plan and was able to repeat key elements of the plan. All questions were answered to their satisfaction.    LILLETTE Gaines Ada, FNP, have reviewed all documentation for this visit. The documentation on 07/15/24 for the exam, diagnosis, procedures, and orders are all accurate and complete.   IF YOU HAVE BEEN REFERRED TO A SPECIALIST, IT MAY TAKE 1-2 WEEKS TO SCHEDULE/PROCESS THE REFERRAL. IF YOU HAVE NOT HEARD FROM US /SPECIALIST IN TWO WEEKS, PLEASE GIVE US  A CALL AT [phone].

## 2024-07-16 ENCOUNTER — Encounter: Payer: Self-pay | Admitting: Nurse Practitioner

## 2024-07-16 DIAGNOSIS — M545 Low back pain, unspecified: Secondary | ICD-10-CM | POA: Insufficient documentation

## 2024-07-16 DIAGNOSIS — R39198 Other difficulties with micturition: Secondary | ICD-10-CM | POA: Insufficient documentation

## 2024-07-16 NOTE — Assessment & Plan Note (Signed)
 Previously on Zepbound , planned switch to Wegovy  delayed due to current symptoms. - Hold off on Wegovy .

## 2024-07-16 NOTE — Assessment & Plan Note (Signed)
 Severe abdominal pain with constipation and urinary symptoms. Differential includes UTI and referred pain from testicular issues. No recent urology consultation. - Order x-ray of the abdomen - Order blood work. - Check urine for urinary tract infection, showed small protein and trace blood, sent urine culture. - Recommended he call the urologist if symptoms persist.

## 2024-07-16 NOTE — Assessment & Plan Note (Signed)
 Lower back pain affecting mobility. Reports a sensation to his left thigh - Order x-ray of the back.

## 2024-07-20 ENCOUNTER — Other Ambulatory Visit: Payer: Self-pay | Admitting: Nurse Practitioner

## 2024-07-20 DIAGNOSIS — M5126 Other intervertebral disc displacement, lumbar region: Secondary | ICD-10-CM

## 2024-07-20 MED ORDER — OXYCODONE-ACETAMINOPHEN 5-325 MG PO TABS
1.0000 | ORAL_TABLET | Freq: Four times a day (QID) | ORAL | 0 refills | Status: DC | PRN
Start: 2024-07-20 — End: 2024-08-10

## 2024-07-21 ENCOUNTER — Other Ambulatory Visit: Payer: Self-pay | Admitting: Urology

## 2024-07-21 DIAGNOSIS — N281 Cyst of kidney, acquired: Secondary | ICD-10-CM

## 2024-07-21 DIAGNOSIS — D49512 Neoplasm of unspecified behavior of left kidney: Secondary | ICD-10-CM

## 2024-07-25 DIAGNOSIS — M5126 Other intervertebral disc displacement, lumbar region: Secondary | ICD-10-CM | POA: Insufficient documentation

## 2024-07-25 DIAGNOSIS — R634 Abnormal weight loss: Secondary | ICD-10-CM | POA: Insufficient documentation

## 2024-07-25 DIAGNOSIS — N2889 Other specified disorders of kidney and ureter: Secondary | ICD-10-CM | POA: Insufficient documentation

## 2024-07-25 DIAGNOSIS — K5900 Constipation, unspecified: Secondary | ICD-10-CM | POA: Insufficient documentation

## 2024-07-25 NOTE — Assessment & Plan Note (Signed)
 Incidental renal mass found during imaging for slow urinary stream. Differential includes cyst, blur, or tumor. Pending urologist evaluation. - Attend urologist appointment for further evaluation.

## 2024-07-25 NOTE — Assessment & Plan Note (Signed)
 Unintentional 6-pound weight loss in one week. Potential causes include decreased appetite or underlying conditions related to renal mass. Discussed concerns about rapid weight loss. - Increase protein intake through diet or supplements. - Monitor weight changes.

## 2024-07-25 NOTE — Assessment & Plan Note (Signed)
 Chronic constipation improved with prune juice. Discontinued Wegovy  and Zepbound . - Continue prune juice intake for bowel regularity.

## 2024-07-25 NOTE — Assessment & Plan Note (Signed)
 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 Persistent severe pain. Neurologist identified bulging discs. Pain management prioritized. - Attend follow-up with neurologist for potential injection therapy. - Discuss potential for physical therapy with neurologist. - Obtain note for work absence for one week. - Document duration of work absence for [employer].

## 2024-07-30 ENCOUNTER — Other Ambulatory Visit: Payer: Self-pay | Admitting: Student

## 2024-07-30 DIAGNOSIS — M5416 Radiculopathy, lumbar region: Secondary | ICD-10-CM

## 2024-08-04 ENCOUNTER — Other Ambulatory Visit: Payer: Self-pay | Admitting: Nurse Practitioner

## 2024-08-04 ENCOUNTER — Other Ambulatory Visit

## 2024-08-04 ENCOUNTER — Inpatient Hospital Stay: Payer: Self-pay | Admitting: Nurse Practitioner

## 2024-08-04 DIAGNOSIS — R9389 Abnormal findings on diagnostic imaging of other specified body structures: Secondary | ICD-10-CM

## 2024-08-04 DIAGNOSIS — M545 Low back pain, unspecified: Secondary | ICD-10-CM

## 2024-08-07 ENCOUNTER — Ambulatory Visit
Admission: RE | Admit: 2024-08-07 | Discharge: 2024-08-07 | Disposition: A | Discharge status: Home / Self Care | Source: Ambulatory Visit | Attending: Urology | Admitting: Urology

## 2024-08-07 DIAGNOSIS — D49512 Neoplasm of unspecified behavior of left kidney: Secondary | ICD-10-CM

## 2024-08-07 DIAGNOSIS — N281 Cyst of kidney, acquired: Secondary | ICD-10-CM

## 2024-08-07 MED ORDER — GADOPICLENOL 0.5 MMOL/ML IV SOLN
9.0000 mL | Freq: Once | INTRAVENOUS | Status: AC | PRN
  Administered 2024-08-07: 9 mL via INTRAVENOUS

## 2024-08-10 ENCOUNTER — Other Ambulatory Visit: Payer: Self-pay | Admitting: Nurse Practitioner

## 2024-08-10 DIAGNOSIS — M5126 Other intervertebral disc displacement, lumbar region: Secondary | ICD-10-CM

## 2024-08-10 MED ORDER — OXYCODONE-ACETAMINOPHEN 5-325 MG PO TABS: 1.0000 | ORAL_TABLET | Freq: Four times a day (QID) | ORAL | 0 refills | Status: AC | PRN

## 2024-08-10 MED ORDER — GABAPENTIN 300 MG PO CAPS: 300.0000 mg | ORAL_CAPSULE | Freq: Three times a day (TID) | ORAL | 2 refills | Status: DC

## 2024-08-10 MED ORDER — GABAPENTIN 100 MG PO CAPS: 100.0000 mg | ORAL_CAPSULE | Freq: Two times a day (BID) | ORAL | 2 refills | Status: DC

## 2024-08-10 NOTE — Discharge Instructions (Signed)

## 2024-08-10 NOTE — Telephone Encounter (Signed)
 Please write a return to work letter to go back on October 6th for 1/2 days until October 20th.

## 2024-08-11 ENCOUNTER — Ambulatory Visit
Admission: RE | Admit: 2024-08-11 | Discharge: 2024-08-11 | Disposition: A | Discharge status: Home / Self Care | Source: Ambulatory Visit | Attending: Student | Admitting: Student

## 2024-08-11 DIAGNOSIS — M5416 Radiculopathy, lumbar region: Secondary | ICD-10-CM

## 2024-08-11 LAB — MULTIPLE MYELOMA PANEL, SERUM
Albumin SerPl Elph-Mcnc: 3.4 g/dL (ref 2.9–4.4)
Albumin/Glob SerPl: 1.2 (ref 0.7–1.7)
Alpha 1: 0.2 g/dL (ref 0.0–0.4)
Alpha2 Glob SerPl Elph-Mcnc: 0.6 g/dL (ref 0.4–1.0)
B-Globulin SerPl Elph-Mcnc: 1.1 g/dL (ref 0.7–1.3)
Gamma Glob SerPl Elph-Mcnc: 1 g/dL (ref 0.4–1.8)
Globulin, Total: 2.9 g/dL (ref 2.2–3.9)
IgA/Immunoglobulin A, Serum: 350 mg/dL (ref 61–437)
IgG (Immunoglobin G), Serum: 1035 mg/dL (ref 603–1613)
IgM (Immunoglobulin M), Srm: 33 mg/dL (ref 20–172)
Total Protein: 6.3 g/dL (ref 6.0–8.5)

## 2024-08-11 MED ORDER — METHYLPREDNISOLONE ACETATE 40 MG/ML INJ SUSP (RADIOLOG
80.0000 mg | Freq: Once | INTRAMUSCULAR | Status: AC
Start: 2024-08-11 — End: 2024-08-11
  Administered 2024-08-11: 80 mg via EPIDURAL

## 2024-08-11 MED ORDER — IOPAMIDOL (ISOVUE-M 200) INJECTION 41%
1.0000 mL | Freq: Once | INTRAMUSCULAR | Status: AC
Start: 2024-08-11 — End: 2024-08-11
  Administered 2024-08-11: 1 mL via EPIDURAL

## 2024-10-28 ENCOUNTER — Encounter: Payer: Self-pay | Admitting: Nurse Practitioner

## 2024-11-29 ENCOUNTER — Other Ambulatory Visit: Payer: Self-pay | Admitting: Nurse Practitioner

## 2024-12-28 ENCOUNTER — Encounter: Payer: Self-pay | Admitting: Nurse Practitioner

## 2025-04-05 ENCOUNTER — Encounter: Admitting: Nurse Practitioner
